# Patient Record
Sex: Female | Born: 1960 | Race: Black or African American | Hispanic: No | Marital: Single | Smoking: Never smoker
Health system: Southern US, Community
[De-identification: ages and names within clinical notes are randomized; demographics above are authoritative.]

## PROBLEM LIST (undated history)

## (undated) DIAGNOSIS — Z8041 Family history of malignant neoplasm of ovary: Secondary | ICD-10-CM

## (undated) DIAGNOSIS — Z803 Family history of malignant neoplasm of breast: Secondary | ICD-10-CM

## (undated) DIAGNOSIS — Z1371 Encounter for nonprocreative screening for genetic disease carrier status: Secondary | ICD-10-CM

## (undated) DIAGNOSIS — I10 Essential (primary) hypertension: Secondary | ICD-10-CM

## (undated) HISTORY — DX: Family history of malignant neoplasm of breast: Z80.3

## (undated) HISTORY — DX: Encounter for nonprocreative screening for genetic disease carrier status: Z13.71

## (undated) HISTORY — DX: Essential (primary) hypertension: I10

## (undated) HISTORY — PX: WRIST SURGERY: SHX841

## (undated) HISTORY — DX: Family history of malignant neoplasm of ovary: Z80.41

---

## 2004-08-27 ENCOUNTER — Emergency Department: Payer: Self-pay | Admitting: Emergency Medicine

## 2005-05-07 ENCOUNTER — Ambulatory Visit: Payer: Self-pay | Admitting: Family Medicine

## 2006-12-19 ENCOUNTER — Other Ambulatory Visit: Payer: Self-pay

## 2006-12-19 ENCOUNTER — Emergency Department: Payer: Self-pay | Admitting: Emergency Medicine

## 2009-01-17 ENCOUNTER — Ambulatory Visit: Payer: Self-pay | Admitting: Family Medicine

## 2009-12-23 ENCOUNTER — Ambulatory Visit: Payer: Self-pay | Admitting: Internal Medicine

## 2013-09-13 ENCOUNTER — Emergency Department: Payer: Self-pay | Admitting: Emergency Medicine

## 2013-12-05 ENCOUNTER — Ambulatory Visit: Payer: Self-pay | Admitting: Orthopedic Surgery

## 2014-07-15 DIAGNOSIS — Z803 Family history of malignant neoplasm of breast: Secondary | ICD-10-CM

## 2014-07-15 DIAGNOSIS — Z1371 Encounter for nonprocreative screening for genetic disease carrier status: Secondary | ICD-10-CM

## 2014-07-15 HISTORY — DX: Family history of malignant neoplasm of breast: Z80.3

## 2014-07-15 HISTORY — DX: Encounter for nonprocreative screening for genetic disease carrier status: Z13.71

## 2015-10-14 ENCOUNTER — Ambulatory Visit
Admission: RE | Admit: 2015-10-14 | Discharge: 2015-10-14 | Disposition: A | Discharge status: Home / Self Care | Payer: BC Managed Care – PPO | Source: Ambulatory Visit | Attending: Certified Nurse Midwife | Admitting: Certified Nurse Midwife

## 2015-10-14 ENCOUNTER — Other Ambulatory Visit: Payer: Self-pay | Admitting: Certified Nurse Midwife

## 2015-10-14 DIAGNOSIS — R52 Pain, unspecified: Secondary | ICD-10-CM

## 2015-10-14 DIAGNOSIS — M79652 Pain in left thigh: Secondary | ICD-10-CM | POA: Insufficient documentation

## 2015-10-14 DIAGNOSIS — R102 Pelvic and perineal pain: Secondary | ICD-10-CM | POA: Insufficient documentation

## 2015-10-14 DIAGNOSIS — W1830XA Fall on same level, unspecified, initial encounter: Secondary | ICD-10-CM | POA: Diagnosis not present

## 2015-10-16 ENCOUNTER — Other Ambulatory Visit: Payer: Self-pay | Admitting: Certified Nurse Midwife

## 2015-10-16 DIAGNOSIS — R102 Pelvic and perineal pain: Secondary | ICD-10-CM

## 2015-10-17 ENCOUNTER — Other Ambulatory Visit: Payer: Self-pay | Admitting: Certified Nurse Midwife

## 2015-10-17 ENCOUNTER — Ambulatory Visit
Admission: RE | Admit: 2015-10-17 | Discharge: 2015-10-17 | Disposition: A | Discharge status: Home / Self Care | Payer: BC Managed Care – PPO | Source: Ambulatory Visit | Attending: Certified Nurse Midwife | Admitting: Certified Nurse Midwife

## 2015-10-17 DIAGNOSIS — M79605 Pain in left leg: Secondary | ICD-10-CM

## 2015-10-17 DIAGNOSIS — R102 Pelvic and perineal pain: Secondary | ICD-10-CM | POA: Diagnosis present

## 2016-06-10 ENCOUNTER — Encounter: Payer: Self-pay | Admitting: Obstetrics & Gynecology

## 2016-06-10 ENCOUNTER — Ambulatory Visit: Payer: Self-pay | Admitting: Obstetrics & Gynecology

## 2017-03-24 IMAGING — US US EXTREM LOW VENOUS*L*
1 series · 13 of 24 positions shown · non-contrast
Comparison: Prior duplex venous ultrasound 12/23/2009

CLINICAL DATA: 55-year-old female with left lower extremity pain



[Series 1: us extrem low venous*left* · 0.07mm/px · 13 of 32 slices shown]
[im 1/32]
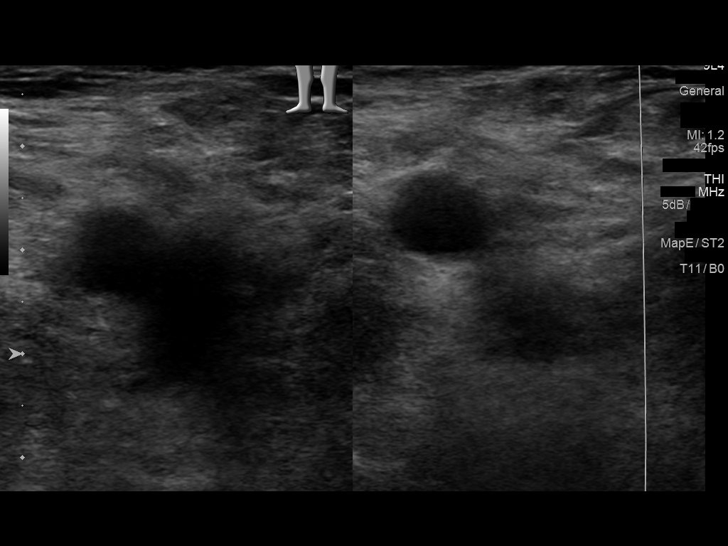
[im 3/32]
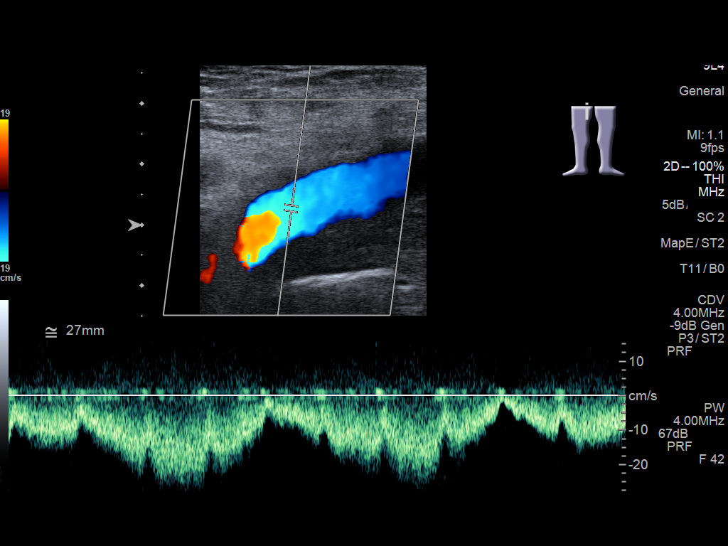
[im 6/32]
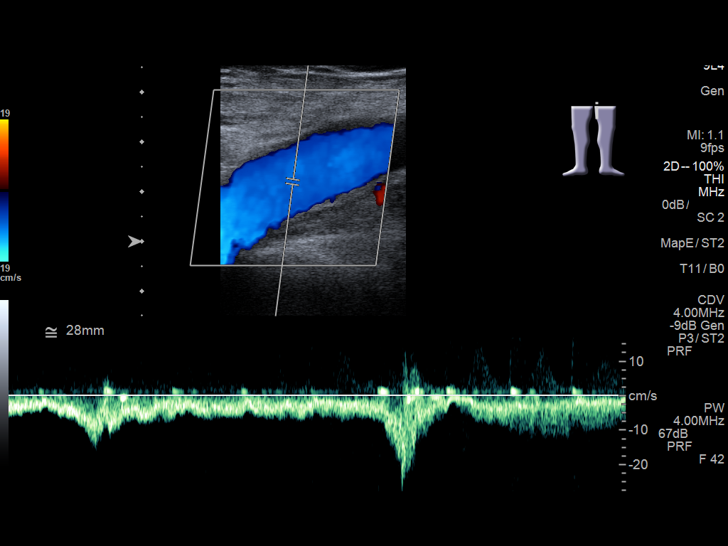
[im 9/32]
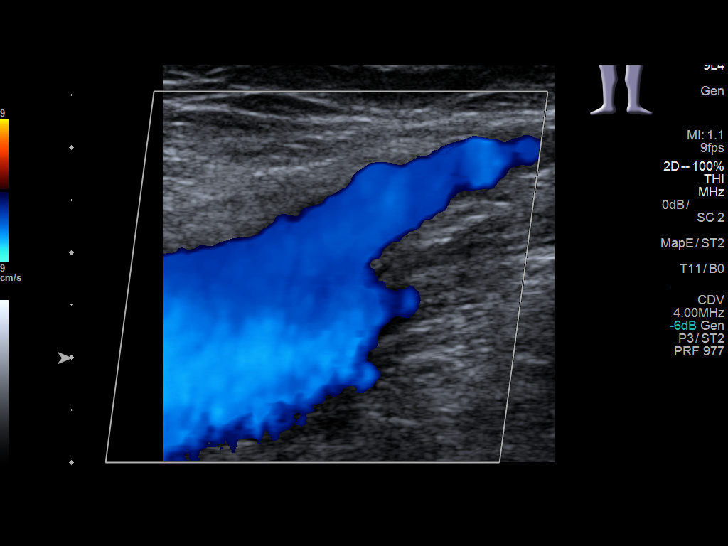
[im 11/32]
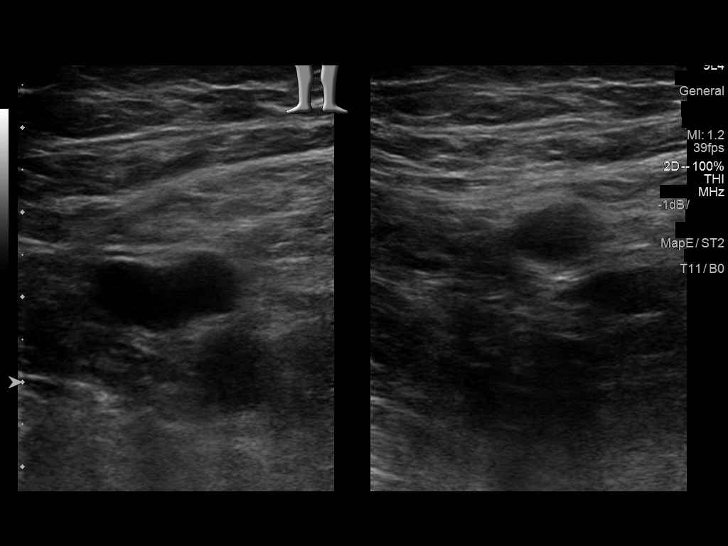
[im 14/32]
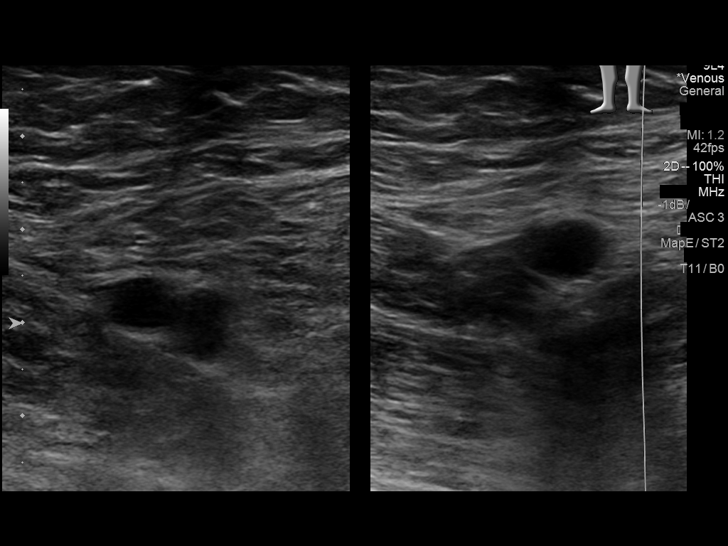
[im 17/32]
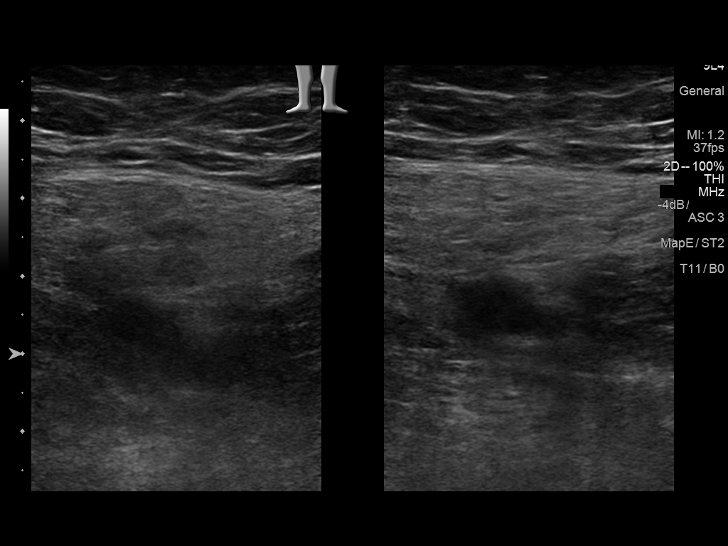
[im 18/32]
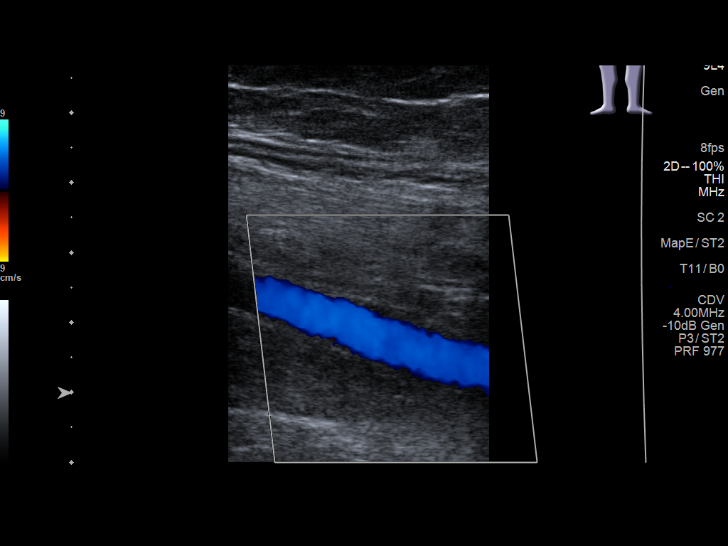
[im 21/32]
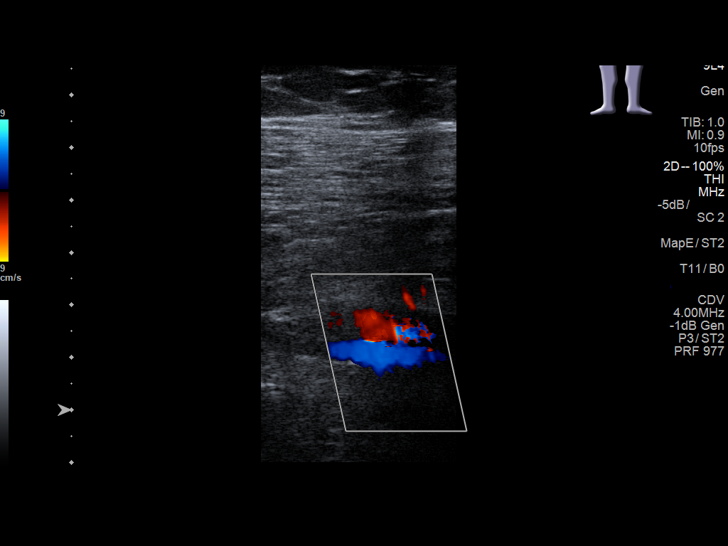
[im 23/32]
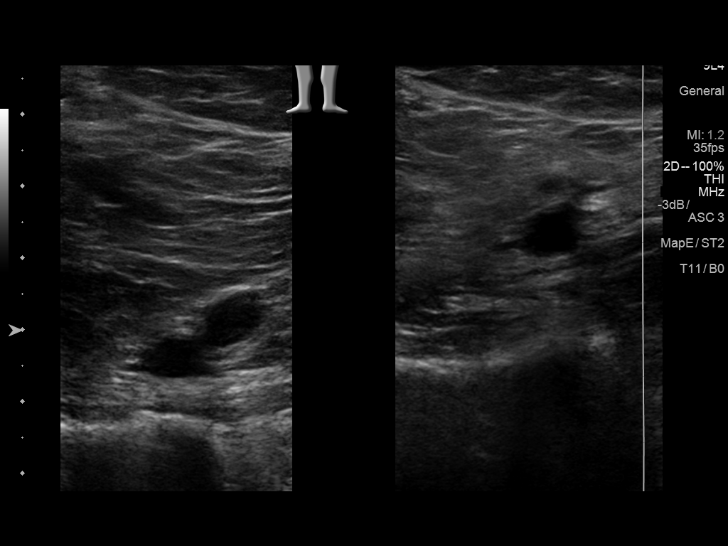
[im 26/32]
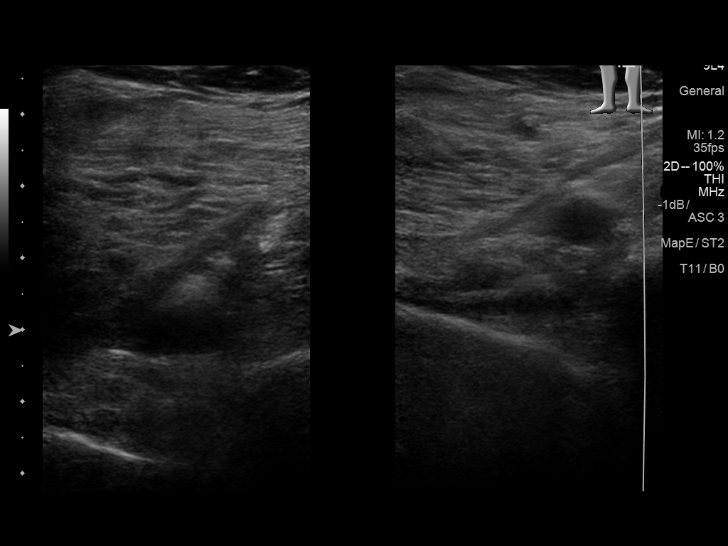
[im 29/32]
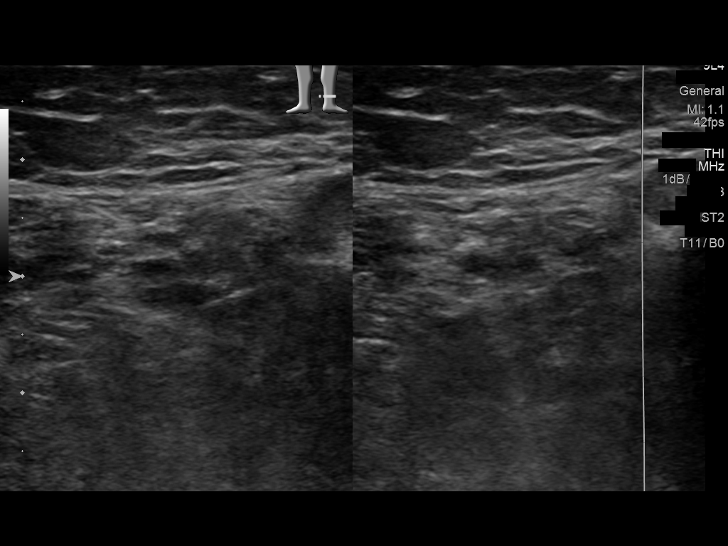
[im 32/32]
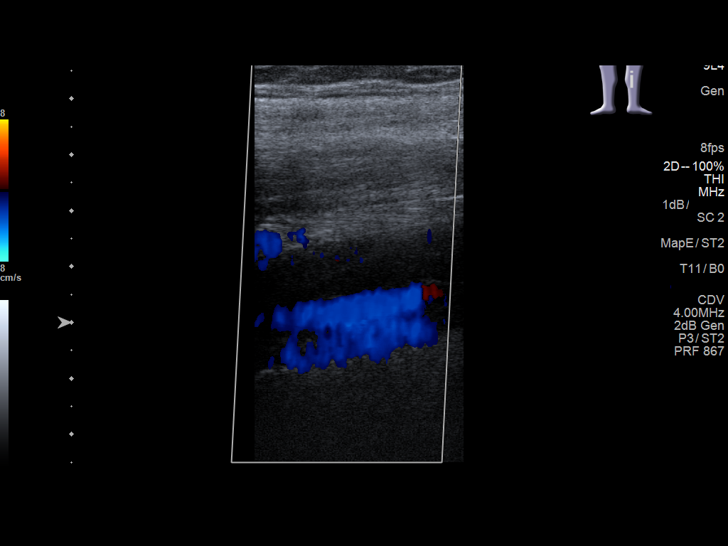

[13 of 24 positions shown; findings below may reference images not displayed]

FINDINGS: Contralateral Common Femoral Vein: Respiratory phasicity is normal
and symmetric with the symptomatic side. No evidence of thrombus.
Normal compressibility.

Common Femoral Vein: No evidence of thrombus. Normal
compressibility, respiratory phasicity and response to augmentation.

Saphenofemoral Junction: No evidence of thrombus. Normal
compressibility and flow on color Doppler imaging.

Profunda Femoral Vein: No evidence of thrombus. Normal
compressibility and flow on color Doppler imaging.

Femoral Vein: No evidence of thrombus. Normal compressibility,
respiratory phasicity and response to augmentation.

Popliteal Vein: No evidence of thrombus. Normal compressibility,
respiratory phasicity and response to augmentation.

Calf Veins: No evidence of thrombus. Normal compressibility and flow
on color Doppler imaging.

Superficial Great Saphenous Vein: No evidence of thrombus. Normal
compressibility and flow on color Doppler imaging.

Venous Reflux:  None.

Other Findings:  None.
IMPRESSION: No evidence of deep venous thrombosis.

## 2017-05-21 ENCOUNTER — Ambulatory Visit: Payer: Self-pay | Admitting: Obstetrics & Gynecology

## 2017-05-21 ENCOUNTER — Encounter: Payer: Self-pay | Admitting: Obstetrics & Gynecology

## 2017-05-21 ENCOUNTER — Ambulatory Visit (INDEPENDENT_AMBULATORY_CARE_PROVIDER_SITE_OTHER): Payer: BC Managed Care – PPO | Admitting: Obstetrics & Gynecology

## 2017-05-21 VITALS — BP 130/90 | HR 80 | Ht 67.0 in | Wt 204.0 lb

## 2017-05-21 DIAGNOSIS — Z13220 Encounter for screening for lipoid disorders: Secondary | ICD-10-CM

## 2017-05-21 DIAGNOSIS — Z1321 Encounter for screening for nutritional disorder: Secondary | ICD-10-CM

## 2017-05-21 DIAGNOSIS — Z131 Encounter for screening for diabetes mellitus: Secondary | ICD-10-CM

## 2017-05-21 DIAGNOSIS — Z1211 Encounter for screening for malignant neoplasm of colon: Secondary | ICD-10-CM

## 2017-05-21 DIAGNOSIS — Z1329 Encounter for screening for other suspected endocrine disorder: Secondary | ICD-10-CM | POA: Diagnosis not present

## 2017-05-21 DIAGNOSIS — Z124 Encounter for screening for malignant neoplasm of cervix: Secondary | ICD-10-CM

## 2017-05-21 DIAGNOSIS — Z0000 Encounter for general adult medical examination without abnormal findings: Secondary | ICD-10-CM | POA: Diagnosis not present

## 2017-05-21 DIAGNOSIS — E669 Obesity, unspecified: Secondary | ICD-10-CM | POA: Diagnosis not present

## 2017-05-21 DIAGNOSIS — Z1231 Encounter for screening mammogram for malignant neoplasm of breast: Secondary | ICD-10-CM | POA: Diagnosis not present

## 2017-05-21 DIAGNOSIS — Z1239 Encounter for other screening for malignant neoplasm of breast: Secondary | ICD-10-CM

## 2017-05-21 NOTE — Progress Notes (Signed)
HPI:      Ms. April Cross is a 57 y.o. [phone] who LMP was No LMP recorded (lmp unknown). Patient has had a hysterectomy., she presents today for her annual examination. The patient has no complaints today. The patient is sexually active. Her last pap: approximate date 2016 and was normal and last mammogram: approximate date 2016 and was normal. The patient does perform self breast exams.  There is notable family history of breast or ovarian cancer in her family. BRCA Neg (she has a variant of unknown utility); T-C 16 % lifetime risk. The patient has regular exercise: yes.  The patient denies current symptoms of depression.    GYN History: Contraception: IUD, 5 years this fall.  No sx's of menopause, no bleeding.  FH menopause 21, 20 (mom, sis). Colonoscopy, not done yet No recent labs  PMHx: Past Medical History:  Diagnosis Date  . Hypertension    Past Surgical History:  Procedure Laterality Date  . WRIST SURGERY     Family History  Problem Relation Age of Onset  . Brain cancer Paternal Uncle   . Lung cancer Paternal Uncle   . Leukemia Maternal Grandmother   . Ovarian cancer Maternal Grandmother   . Breast cancer Paternal Grandfather    Social History   Tobacco Use  . Smoking status: Never Smoker  . Smokeless tobacco: Never Used  Substance Use Topics  . Alcohol use: No    Frequency: Never  . Drug use: No    Current Outpatient Medications:  .  levonorgestrel (MIRENA) 20 MCG/24HR IUD, 1 each by Intrauterine route once., Disp: , Rfl:  .  methocarbamol (ROBAXIN) 500 MG tablet, TAKE 1 (ONE) TABLET EVERY SIX HOURS, AS NEEDED FOR SPASM, Disp: , Rfl: 0 .  methylPREDNISolone (MEDROL DOSEPAK) 4 MG TBPK tablet, TAKE 6 TABLETS ON DAY 1 AS DIRECTED ON PACKAGE AND DECREASE BY 1 TAB EACH DAY FOR A TOTAL OF 6 DAYS, Disp: , Rfl: 0 .  naproxen (NAPROSYN) 500 MG tablet, TAKE 1 TABLET BY MOUTH 2 TIMES DAILY AS NEEDED, Disp: , Rfl: 0 Allergies: Penicillins  Review of Systems    Constitutional: Negative for chills, fever and malaise/fatigue.  HENT: Negative for congestion, sinus pain and sore throat.   Eyes: Negative for blurred vision and pain.  Respiratory: Negative for cough and wheezing.   Cardiovascular: Negative for chest pain and leg swelling.  Gastrointestinal: Negative for abdominal pain, constipation, diarrhea, heartburn, nausea and vomiting.  Genitourinary: Negative for dysuria, frequency, hematuria and urgency.  Musculoskeletal: Negative for back pain, joint pain, myalgias and neck pain.  Skin: Negative for itching and rash.  Neurological: Negative for dizziness, tremors and weakness.  Endo/Heme/Allergies: Does not bruise/bleed easily.  Psychiatric/Behavioral: Negative for depression. The patient is not nervous/anxious and does not have insomnia.     Objective: BP 130/90   Pulse 80   Ht '5\' 7"'$  (1.702 m)   Wt 204 lb (92.5 kg)   LMP  (LMP Unknown)   BMI 31.95 kg/m   Filed Weights   05/21/17 0951  Weight: 204 lb (92.5 kg)   Body mass index is 31.95 kg/m. Physical Exam  Constitutional: She is oriented to person, place, and time. She appears well-developed and well-nourished. No distress.  Genitourinary: Rectum normal, vagina normal and uterus normal. Pelvic exam was performed with patient supine. There is no rash or lesion on the right labia. There is no rash or lesion on the left labia. Vagina exhibits no lesion. No bleeding in  the vagina. Right adnexum does not display mass and does not display tenderness. Left adnexum does not display mass and does not display tenderness. Cervix does not exhibit motion tenderness, lesion, friability or polyp.   Uterus is mobile and midaxial. Uterus is not enlarged or exhibiting a mass.  HENT:  Head: Normocephalic and atraumatic. Head is without laceration.  Right Ear: Hearing normal.  Left Ear: Hearing normal.  Nose: No epistaxis.  No foreign bodies.  Mouth/Throat: Uvula is midline, oropharynx is clear and  moist and mucous membranes are normal.  Eyes: Pupils are equal, round, and reactive to light.  Neck: Normal range of motion. Neck supple. No thyromegaly present.  Cardiovascular: Normal rate and regular rhythm. Exam reveals no gallop and no friction rub.  No murmur heard. Pulmonary/Chest: Effort normal and breath sounds normal. No respiratory distress. She has no wheezes. Right breast exhibits no mass, no skin change and no tenderness. Left breast exhibits no mass, no skin change and no tenderness.  Abdominal: Soft. Bowel sounds are normal. She exhibits no distension. There is no tenderness. There is no rebound.  Musculoskeletal: Normal range of motion.  Neurological: She is alert and oriented to person, place, and time. No cranial nerve deficit.  Skin: Skin is warm and dry.  Psychiatric: She has a normal mood and affect. Judgment normal.  Vitals reviewed.  Assessment: 1. Annual physical exam   2. Screening for breast cancer   3. Screening for cervical cancer   4. Screening for thyroid disorder   5. Screening for diabetes mellitus   6. Encounter for vitamin deficiency screening   7. Screening for cholesterol level   8. Screen for colon cancer   9. Obesity (BMI 30-39.9)    Screening Plan:            1.  Cervical Screening-  Pap smear done today  2. Breast screening- Exam annually and mammogram>40 planned   3. Colonoscopy every 10 years - - - needs to be done (will schedule), Hemoccult testing - after age 31  4. Labs To return fasting at a later date  5. Counseling for contraception: IUD  To be removed in fall; monitor for bleeding and menopause sx's thereafter  6. Obesity (BMI 30-39.9) - Weight loss, diet and exercise counseled    F/U  Return in about 6 months (around 11/21/2017) for Follow up for IUD removal, also LAB appt soon.  Barnett Applebaum, MD, [facility], [facility] 05/21/2017  10:28 AM

## 2017-05-21 NOTE — Patient Instructions (Signed)
PAP every three years Mammogram every year    Call [phone] to schedule at [place] Colonoscopy every 10 years Labs soon 

## 2017-05-25 ENCOUNTER — Encounter: Payer: Self-pay | Admitting: Obstetrics and Gynecology

## 2017-05-25 LAB — IGP, APTIMA HPV
HPV APTIMA: NEGATIVE
PAP SMEAR COMMENT: 0

## 2017-05-26 ENCOUNTER — Ambulatory Visit: Payer: Self-pay | Admitting: Obstetrics & Gynecology

## 2017-06-04 ENCOUNTER — Other Ambulatory Visit: Payer: Self-pay

## 2017-06-10 ENCOUNTER — Other Ambulatory Visit: Payer: Self-pay

## 2017-06-10 ENCOUNTER — Telehealth: Payer: Self-pay

## 2017-06-10 DIAGNOSIS — Z1211 Encounter for screening for malignant neoplasm of colon: Secondary | ICD-10-CM

## 2017-06-10 NOTE — Telephone Encounter (Signed)
Gastroenterology Pre-Procedure Review  Request Date: 07/08/17 Requesting Physician: Dr. Bonna Gains  PATIENT REVIEW QUESTIONS: The patient responded to the following health history questions as indicated:    1. Are you having any GI issues? yes (Constipation, Bowel Movement Infrequency requiring pt to take Miralax) 2. Do you have a personal history of Polyps? no 3. Do you have a family history of Colon Cancer or Polyps? no 4. Diabetes Mellitus? no 5. Joint replacements in the past 12 months?no 6. Major health problems in the past 3 months?no 7. Any artificial heart valves, MVP, or defibrillator?no    MEDICATIONS & ALLERGIES:    Patient reports the following regarding taking any anticoagulation/antiplatelet therapy:   Plavix, Coumadin, Eliquis, Xarelto, Lovenox, Pradaxa, Brilinta, or Effient? no Aspirin? no  Patient confirms/reports the following medications:  Current Outpatient Medications  Medication Sig Dispense Refill  . levonorgestrel (MIRENA) 20 MCG/24HR IUD 1 each by Intrauterine route once.    . methocarbamol (ROBAXIN) 500 MG tablet TAKE 1 (ONE) TABLET EVERY SIX HOURS, AS NEEDED FOR SPASM  0  . methylPREDNISolone (MEDROL DOSEPAK) 4 MG TBPK tablet TAKE 6 TABLETS ON DAY 1 AS DIRECTED ON PACKAGE AND DECREASE BY 1 TAB EACH DAY FOR A TOTAL OF 6 DAYS  0  . naproxen (NAPROSYN) 500 MG tablet TAKE 1 TABLET BY MOUTH 2 TIMES DAILY AS NEEDED  0   No current facility-administered medications for this visit.     Patient confirms/reports the following allergies:  Allergies  Allergen Reactions  . Penicillins     No orders of the defined types were placed in this encounter.   AUTHORIZATION INFORMATION Primary Insurance: 1D#: Group #:  Secondary Insurance: 1D#: Group #:  SCHEDULE INFORMATION: Date: 07/08/17 Time: Location:[facility]

## 2017-06-16 ENCOUNTER — Other Ambulatory Visit: Payer: BC Managed Care – PPO

## 2017-07-08 ENCOUNTER — Ambulatory Visit: Payer: BC Managed Care – PPO | Admitting: Anesthesiology

## 2017-07-08 ENCOUNTER — Encounter
Admission: RE | Disposition: A | Discharge status: Home / Self Care | Payer: Self-pay | Source: Ambulatory Visit | Attending: Gastroenterology

## 2017-07-08 ENCOUNTER — Ambulatory Visit
Admission: RE | Admit: 2017-07-08 | Discharge: 2017-07-08 | Disposition: A | Discharge status: Home / Self Care | Payer: BC Managed Care – PPO | Source: Ambulatory Visit | Attending: Gastroenterology | Admitting: Gastroenterology

## 2017-07-08 DIAGNOSIS — I10 Essential (primary) hypertension: Secondary | ICD-10-CM | POA: Insufficient documentation

## 2017-07-08 DIAGNOSIS — D123 Benign neoplasm of transverse colon: Secondary | ICD-10-CM | POA: Diagnosis not present

## 2017-07-08 DIAGNOSIS — Z7952 Long term (current) use of systemic steroids: Secondary | ICD-10-CM | POA: Insufficient documentation

## 2017-07-08 DIAGNOSIS — K621 Rectal polyp: Secondary | ICD-10-CM | POA: Insufficient documentation

## 2017-07-08 DIAGNOSIS — Z791 Long term (current) use of non-steroidal anti-inflammatories (NSAID): Secondary | ICD-10-CM | POA: Insufficient documentation

## 2017-07-08 DIAGNOSIS — Z975 Presence of (intrauterine) contraceptive device: Secondary | ICD-10-CM | POA: Diagnosis not present

## 2017-07-08 DIAGNOSIS — Z880 Allergy status to penicillin: Secondary | ICD-10-CM | POA: Diagnosis not present

## 2017-07-08 DIAGNOSIS — Z1211 Encounter for screening for malignant neoplasm of colon: Secondary | ICD-10-CM | POA: Diagnosis present

## 2017-07-08 HISTORY — PX: COLONOSCOPY WITH PROPOFOL: SHX5780

## 2017-07-08 SURGERY — COLONOSCOPY WITH PROPOFOL
Anesthesia: General

## 2017-07-08 MED ORDER — MIDAZOLAM HCL 2 MG/2ML IJ SOLN
INTRAMUSCULAR | Status: AC
  Filled 2017-07-08: qty 2

## 2017-07-08 MED ORDER — LIDOCAINE HCL (PF) 1 % IJ SOLN
2.0000 mL | Freq: Once | INTRAMUSCULAR | Status: AC
  Administered 2017-07-08: 0.3 mL via INTRADERMAL

## 2017-07-08 MED ORDER — FENTANYL CITRATE (PF) 100 MCG/2ML IJ SOLN
INTRAMUSCULAR | Status: AC
  Filled 2017-07-08: qty 2

## 2017-07-08 MED ORDER — PROPOFOL 500 MG/50ML IV EMUL
INTRAVENOUS | Status: DC | PRN
  Administered 2017-07-08: 180 ug/kg/min via INTRAVENOUS

## 2017-07-08 MED ORDER — LIDOCAINE HCL (PF) 2 % IJ SOLN
INTRAMUSCULAR | Status: AC
  Filled 2017-07-08: qty 10

## 2017-07-08 MED ORDER — FENTANYL CITRATE (PF) 100 MCG/2ML IJ SOLN
INTRAMUSCULAR | Status: DC | PRN
  Administered 2017-07-08: 50 ug via INTRAVENOUS

## 2017-07-08 MED ORDER — SODIUM CHLORIDE 0.9 % IV SOLN
INTRAVENOUS | Status: DC
  Administered 2017-07-08: 1000 mL via INTRAVENOUS

## 2017-07-08 MED ORDER — PROPOFOL 10 MG/ML IV BOLUS
INTRAVENOUS | Status: DC | PRN
  Administered 2017-07-08: 50 mg via INTRAVENOUS

## 2017-07-08 MED ORDER — MIDAZOLAM HCL 2 MG/2ML IJ SOLN
INTRAMUSCULAR | Status: DC | PRN
  Administered 2017-07-08: 2 mg via INTRAVENOUS

## 2017-07-08 MED ORDER — LIDOCAINE HCL (PF) 1 % IJ SOLN
INTRAMUSCULAR | Status: AC
  Administered 2017-07-08: 0.3 mL via INTRADERMAL
  Filled 2017-07-08: qty 2

## 2017-07-08 MED ORDER — PROPOFOL 500 MG/50ML IV EMUL
INTRAVENOUS | Status: AC
  Filled 2017-07-08: qty 50

## 2017-07-08 NOTE — Op Note (Signed)
[facility] Patient Name: April Cross Procedure Date: 07/08/2017 8:10 AM MRN: [id number] Account #: [id number] Date of Birth: 20-Jan-1961 Admit Type: Outpatient Age: 57 Room: [facility] ENDO ROOM 2 Gender: Female Note Status: Finalized Procedure:            Colonoscopy Indications:          Screening for colorectal malignant neoplasm, This is                        the patient's first colonoscopy Providers:            Lin Landsman MD, MD Referring MD:         Ashok Norris, MD (Referring MD) Medicines:            Monitored Anesthesia Care Complications:        No immediate complications. Estimated blood loss:                        Minimal. Procedure:            Pre-Anesthesia Assessment:                       - Prior to the procedure, a History and Physical was                        performed, and patient medications and allergies were                        reviewed. The patient is competent. The risks and                        benefits of the procedure and the sedation options and                        risks were discussed with the patient. All questions                        were answered and informed consent was obtained.                        Patient identification and proposed procedure were                        verified by the physician, the nurse, the                        anesthesiologist, the anesthetist and the technician in                        the pre-procedure area in the procedure room in the                        endoscopy suite. Mental Status Examination: alert and                        oriented. Airway Examination: normal oropharyngeal                        airway and neck mobility. Respiratory Examination:  clear to auscultation. CV Examination: normal.                        Prophylactic Antibiotics: The patient does not require                        prophylactic antibiotics. Prior  Anticoagulants: The                        patient has taken no previous anticoagulant or                        antiplatelet agents. ASA Grade Assessment: II - A                        patient with mild systemic disease. After reviewing the                        risks and benefits, the patient was deemed in                        satisfactory condition to undergo the procedure. The                        anesthesia plan was to use monitored anesthesia care                        (MAC). Immediately prior to administration of                        medications, the patient was re-assessed for adequacy                        to receive sedatives. The heart rate, respiratory rate,                        oxygen saturations, blood pressure, adequacy of                        pulmonary ventilation, and response to care were                        monitored throughout the procedure. The physical status                        of the patient was re-assessed after the procedure.                       After obtaining informed consent, the colonoscope was                        passed under direct vision. Throughout the procedure,                        the patient's blood pressure, pulse, and oxygen                        saturations were monitored continuously. The                        Colonoscope  was introduced through the anus and                        advanced to the the cecum, identified by appendiceal                        orifice and ileocecal valve. The colonoscopy was                        performed without difficulty. The patient tolerated the                        procedure well. The quality of the bowel preparation                        was evaluated using the BBPS [facility] Bowel Preparation                        Scale) with scores of: Right Colon = 3, Transverse                        Colon = 3 and Left Colon = 3 (entire mucosa seen well                        with no residual  staining, small fragments of stool or                        opaque liquid). The total BBPS score equals 9. Findings:      The perianal and digital rectal examinations were normal. Pertinent       negatives include normal sphincter tone and no palpable rectal lesions.      A 9 mm polyp was found in the transverse colon. The polyp was sessile.       The polyp was removed with a hot snare. Resection and retrieval were       complete.      A 5 mm polyp was found in the rectum. The polyp was sessile. The polyp       was removed with a cold snare. Resection and retrieval were complete.      The retroflexed view of the distal rectum and anal verge was normal and       showed no anal or rectal abnormalities.      The exam was otherwise without abnormality. Impression:           - One 9 mm polyp in the transverse colon, removed with                        a hot snare. Resected and retrieved.                       - One 5 mm polyp in the rectum, removed with a cold                        snare. Resected and retrieved.                       - The distal rectum and anal verge are normal on  retroflexion view.                       - The examination was otherwise normal. Recommendation:       - Discharge patient to home (with escort).                       - Resume previous diet today.                       - Continue present medications.                       - Await pathology results.                       - Repeat colonoscopy in 3 - 5 years for surveillance                        based on pathology results. Procedure Code(s):    --- Professional ---                       [phone], Colonoscopy, flexible; with removal of tumor(s),                        polyp(s), or other lesion(s) by snare technique Diagnosis Code(s):    --- Professional ---                       Z12.11, Encounter for screening for malignant neoplasm                        of colon                        D12.3, Benign neoplasm of transverse colon (hepatic                        flexure or splenic flexure)                       K62.1, Rectal polyp CPT copyright 2017 American Medical Association. All rights reserved. The codes documented in this report are preliminary and upon coder review may  be revised to meet current compliance requirements. Dr. Ulyess Mort Lin Landsman MD, MD 07/08/2017 8:37:17 AM This report has been signed electronically. Number of Addenda: 0 Note Initiated On: 07/08/2017 8:10 AM Scope Withdrawal Time: 0 hours 14 minutes 42 seconds  Total Procedure Duration: 0 hours 19 minutes 51 seconds       [facility]

## 2017-07-08 NOTE — Anesthesia Postprocedure Evaluation (Signed)
Anesthesia Post Note  Patient: April Cross  Procedure(s) Performed: COLONOSCOPY WITH PROPOFOL (N/A )  Patient location during evaluation: Endoscopy Anesthesia Type: General Level of consciousness: awake and alert Pain management: pain level controlled Vital Signs Assessment: post-procedure vital signs reviewed and stable Respiratory status: spontaneous breathing and respiratory function stable Cardiovascular status: stable Anesthetic complications: no     Last Vitals:  Vitals:   07/08/17 0712 07/08/17 0842  BP: (!) 126/99   Pulse: 99   Resp: 17   Temp: (!) 36.2 C (!) 36.3 C  SpO2: 100%     Last Pain:  Vitals:   07/08/17 0842  TempSrc: Tympanic  PainSc:                  Dennard Nip K

## 2017-07-08 NOTE — Anesthesia Preprocedure Evaluation (Signed)
Anesthesia Evaluation  Patient identified by MRN, date of birth, ID band Patient awake    Reviewed: Allergy & Precautions, NPO status , Patient's Chart, lab work & pertinent test results  History of Anesthesia Complications Negative for: history of anesthetic complications  Airway Mallampati: III       Dental   Pulmonary neg sleep apnea, neg COPD,           Cardiovascular hypertension (hx , no problems in last few years), (-) Past MI and (-) CHF (-) dysrhythmias (-) Valvular Problems/Murmurs     Neuro/Psych neg Seizures    GI/Hepatic Neg liver ROS, neg GERD  ,  Endo/Other  neg diabetes  Renal/GU negative Renal ROS     Musculoskeletal   Abdominal   Peds  Hematology   Anesthesia Other Findings   Reproductive/Obstetrics                             Anesthesia Physical Anesthesia Plan  ASA: II  Anesthesia Plan: General   Post-op Pain Management:    Induction: Intravenous  PONV Risk Score and Plan: 3 and TIVA, Propofol infusion and Treatment may vary due to age or medical condition  Airway Management Planned: Nasal Cannula  Additional Equipment:   Intra-op Plan:   Post-operative Plan:   Informed Consent: I have reviewed the patients History and Physical, chart, labs and discussed the procedure including the risks, benefits and alternatives for the proposed anesthesia with the patient or authorized representative who has indicated his/her understanding and acceptance.     Plan Discussed with:   Anesthesia Plan Comments:         Anesthesia Quick Evaluation

## 2017-07-08 NOTE — Transfer of Care (Signed)
Immediate Anesthesia Transfer of Care Note  Patient: April Cross  Procedure(s) Performed: COLONOSCOPY WITH PROPOFOL (N/A )  Patient Location: PACU  Anesthesia Type:General  Level of Consciousness: sedated  Airway & Oxygen Therapy: Patient Spontanous Breathing  Post-op Assessment: Report given to RN and Post -op Vital signs reviewed and stable  Post vital signs: Reviewed and stable  Last Vitals:  Vitals Value Taken Time  BP 117/86 07/08/2017  8:42 AM  Temp 36.3 C 07/08/2017  8:42 AM  Pulse 94 07/08/2017  8:46 AM  Resp 22 07/08/2017  8:46 AM  SpO2 96 % 07/08/2017  8:46 AM  Vitals shown include unvalidated device data.  Last Pain:  Vitals:   07/08/17 0842  TempSrc: Tympanic  PainSc:          Complications: No apparent anesthesia complications

## 2017-07-08 NOTE — H&P (Signed)
Cephas Darby, MD [address]  [address] [place]  [place], [place] [address]  Main: [phone]  Fax: [phone] Pager: [phone]  Primary Care Physician:  Ashok Norris, MD Primary Gastroenterologist:  Dr. Cephas Darby  Pre-Procedure History & Physical: HPI:  April Cross is a 57 y.o. female is here for an colonoscopy.   Past Medical History:  Diagnosis Date  . BRCA negative 07/2014   MyRisk neg except APC VUS  . Family history of breast cancer 07/2014   IBIS=16.6%  . Family history of ovarian cancer   . Hypertension     Past Surgical History:  Procedure Laterality Date  . WRIST SURGERY      Prior to Admission medications   Medication Sig Start Date End Date Taking? Authorizing Provider  levonorgestrel (MIRENA) 20 MCG/24HR IUD 1 each by Intrauterine route once.    [provider]  methocarbamol (ROBAXIN) 500 MG tablet TAKE 1 (ONE) TABLET EVERY SIX HOURS, AS NEEDED FOR SPASM 03/13/16   [provider]  methylPREDNISolone (MEDROL DOSEPAK) 4 MG TBPK tablet TAKE 6 TABLETS ON DAY 1 AS DIRECTED ON PACKAGE AND DECREASE BY 1 TAB EACH DAY FOR A TOTAL OF 6 DAYS 05/11/16   [provider]  naproxen (NAPROSYN) 500 MG tablet TAKE 1 TABLET BY MOUTH 2 TIMES DAILY AS NEEDED 04/17/16   [provider]    Allergies as of 06/10/2017 - Review Complete 05/21/2017  Allergen Reaction Noted  . Penicillins      Family History  Problem Relation Age of Onset  . Brain cancer Paternal Uncle   . Lung cancer Paternal Uncle   . Leukemia Maternal Grandmother   . Ovarian cancer Maternal Grandmother   . Breast cancer Paternal Grandfather     Social History   Socioeconomic History  . Marital status: Single    Spouse name: Not on file  . Number of children: Not on file  . Years of education: Not on file  . Highest education level: Not on file  Occupational History  . Not on file  Social Needs  . Financial resource strain: Not on file  .  Food insecurity:    Worry: Not on file    Inability: Not on file  . Transportation needs:    Medical: Not on file    Non-medical: Not on file  Tobacco Use  . Smoking status: Never Smoker  . Smokeless tobacco: Never Used  Substance and Sexual Activity  . Alcohol use: No    Frequency: Never  . Drug use: No  . Sexual activity: Not Currently    Birth control/protection: IUD  Lifestyle  . Physical activity:    Days per week: Not on file    Minutes per session: Not on file  . Stress: Not on file  Relationships  . Social connections:    Talks on phone: Not on file    Gets together: Not on file    Attends religious service: Not on file    Active member of club or organization: Not on file    Attends meetings of clubs or organizations: Not on file    Relationship status: Not on file  . Intimate partner violence:    Fear of current or ex partner: Not on file    Emotionally abused: Not on file    Physically abused: Not on file    Forced sexual activity: Not on file  Other Topics Concern  . Not on file  Social History Narrative  .  Not on file    Review of Systems: See HPI, otherwise negative ROS  Physical Exam: BP (!) 126/99   Pulse 99   Temp (!) 97.2 F (36.2 C) (Tympanic)   Resp 17   Ht _0  (1.702 m)   Wt 204 lb (92.5 kg)   LMP  (LMP Unknown)   SpO2 100%   BMI 31.95 kg/m  General:   Alert,  pleasant and cooperative in NAD Head:  Normocephalic and atraumatic. Neck:  Supple; no masses or thyromegaly. Lungs:  Clear throughout to auscultation.    Heart:  Regular rate and rhythm. Abdomen:  Soft, nontender and nondistended. Normal bowel sounds, without guarding, and without rebound.   Neurologic:  Alert and  oriented x4;  grossly normal neurologically.  Impression/Plan: April Cross is here for an colonoscopy to be performed for colon cancer screening  Risks, benefits, limitations, and alternatives regarding  colonoscopy have been reviewed with the patient.   Questions have been answered.  All parties agreeable.   April Sear, MD  07/08/2017, 8:42 AM

## 2017-07-08 NOTE — Anesthesia Procedure Notes (Signed)
Date/Time: 07/08/2017 8:10 AM Performed by: Allean Found, CRNA Pre-anesthesia Checklist: Patient identified, Emergency Drugs available, Suction available, Patient being monitored and Timeout performed Patient Re-evaluated:Patient Re-evaluated prior to induction Oxygen Delivery Method: Nasal cannula Placement Confirmation: positive ETCO2

## 2017-07-08 NOTE — Anesthesia Post-op Follow-up Note (Signed)
Anesthesia QCDR form completed.        

## 2017-07-09 ENCOUNTER — Encounter: Payer: Self-pay | Admitting: Gastroenterology

## 2017-07-09 LAB — SURGICAL PATHOLOGY

## 2017-11-26 ENCOUNTER — Ambulatory Visit: Payer: BC Managed Care – PPO | Admitting: Obstetrics & Gynecology

## 2017-11-29 ENCOUNTER — Other Ambulatory Visit: Payer: Self-pay

## 2017-11-29 ENCOUNTER — Encounter: Payer: Self-pay | Admitting: Obstetrics & Gynecology

## 2017-11-29 ENCOUNTER — Ambulatory Visit (INDEPENDENT_AMBULATORY_CARE_PROVIDER_SITE_OTHER): Payer: BC Managed Care – PPO | Admitting: Obstetrics & Gynecology

## 2017-11-29 ENCOUNTER — Other Ambulatory Visit: Payer: Self-pay | Admitting: Obstetrics & Gynecology

## 2017-11-29 VITALS — BP 140/98 | Ht 67.0 in | Wt 202.0 lb

## 2017-11-29 DIAGNOSIS — E669 Obesity, unspecified: Secondary | ICD-10-CM

## 2017-11-29 DIAGNOSIS — Z780 Asymptomatic menopausal state: Secondary | ICD-10-CM | POA: Diagnosis not present

## 2017-11-29 DIAGNOSIS — Z30432 Encounter for removal of intrauterine contraceptive device: Secondary | ICD-10-CM

## 2017-11-29 DIAGNOSIS — Z1329 Encounter for screening for other suspected endocrine disorder: Secondary | ICD-10-CM

## 2017-11-29 DIAGNOSIS — Z1211 Encounter for screening for malignant neoplasm of colon: Secondary | ICD-10-CM

## 2017-11-29 DIAGNOSIS — Z131 Encounter for screening for diabetes mellitus: Secondary | ICD-10-CM

## 2017-11-29 DIAGNOSIS — Z1321 Encounter for screening for nutritional disorder: Secondary | ICD-10-CM

## 2017-11-29 NOTE — Patient Instructions (Signed)

## 2017-11-29 NOTE — Progress Notes (Signed)
History of Present Illness:  April Cross is a 57 y.o. that had a Mirena IUD placed approximately 5 years ago. Since that time, she states that no periods or side effects.  Now that she is 59, she is here for removal and no further birth control.  .  The following portions of the patient's history were reviewed and updated as appropriate: allergies, current medications, past family history, past medical history, past social history, past surgical history and problem list.  Patient Active Problem List   Diagnosis Date Noted  . Special screening for malignant neoplasms, colon   . Annual physical exam 05/21/2017  . Obesity (BMI 30-39.9) 05/21/2017   Medications:  Current Outpatient Medications on File Prior to Visit  Medication Sig Dispense Refill  . levonorgestrel (MIRENA) 20 MCG/24HR IUD 1 each by Intrauterine route once.    . methocarbamol (ROBAXIN) 500 MG tablet TAKE 1 (ONE) TABLET EVERY SIX HOURS, AS NEEDED FOR SPASM  0  . methylPREDNISolone (MEDROL DOSEPAK) 4 MG TBPK tablet TAKE 6 TABLETS ON DAY 1 AS DIRECTED ON PACKAGE AND DECREASE BY 1 TAB EACH DAY FOR A TOTAL OF 6 DAYS  0  . naproxen (NAPROSYN) 500 MG tablet TAKE 1 TABLET BY MOUTH 2 TIMES DAILY AS NEEDED  0   No current facility-administered medications on file prior to visit.    Allergies: is allergic to penicillins.  Physical Exam:  BP (!) 140/98   Ht 5\' 7"  (1.702 m)   Wt 202 lb (91.6 kg)   LMP  (LMP Unknown)   BMI 31.64 kg/m  Body mass index is 31.64 kg/m. Constitutional: Well nourished, well developed female in no acute distress.  Abdomen: diffusely non tender to palpation, non distended, and no masses, hernias Neuro: Grossly intact Psych:  Normal mood and affect.    Pelvic exam:  Two IUD strings present seen coming from the cervical os. EGBUS, vaginal vault and cervix: within normal limits  IUD Removal Strings of IUD identified and grasped.  IUD removed without problem.  Pt tolerated this well.  IUD noted  to be intact.  Assessment: IUD Removal  Plan: IUD removed and plan for contraception is no method. Will test labs in 3 weeks Monitor for s/sx menopause She was amenable to this plan. Other labs today (screening)  HRT I have discussed MENOPAUSE and HRT as ell as alternatives with the patient in detail.  The risk/benefits of it were reviewed.  She understands that during menopause Estrogen decreases dramatically and that this results in an increased risk of cardiovascular disease as well as osteoporosis.  We have also discussed the fact that hot flashes often result from a decrease in Estrogen, and that by replacing Estrogen, they can often be alleviated.  We have discussed skin, vaginal and urinary tract changes that may also take place from this drop in Estrogen.  Emotional changes have also been linked to Estrogen and we have briefly discussed this.  The benefits of HRT including decrease in hot flashes, vaginal dryness, and osteoporosis were discussed.  The emotional benefit and a possible change in her cardiovascular risk profile was also reviewed.  The risks associated with Hormone Replacement Therapy were also reviewed.  The use of unopposed Estrogen and its relationship to endometrial cancer was discussed.  The addition of Progesterone and its beneficial effect on endometrial cancer was also noted.  The fact that there has been no consistent definitive studies showing an increase in breast cancer in women who use HRT was discussed  with the patient.  The possible side effects including breast tenderness, fluid retention, mood changes and vaginal bleeding were discussed.  The patient was informed that this is an elective medication and that she may choose not to take Hormone Replacement Therapy.  Literature on HRT was given, and I believe that after answering all of the patient's questions, she has an adequate and informed understanding of HRT.  Special emphasis on the WHI study, as well as several  studies since that pertaining to the risks and benefits of estrogen replacement therapy were compared.  The possible limitations of these studies were discussed including the age stratification of the WHI study.  The possible role of Progesterone in these studies was discussed in detail.  I believe that the patient has an informed knowledge of the risks and benefits of HRT.  I have specifically discussed WHI findings and current updates.  Different type of hormone formulation and methods of taking hormone replacement therapy discussed.  A total of 15 minutes were spent face-to-face with the patient during this encounter and over half of that time dealt with counseling and coordination of care.   Barnett Applebaum, M.D. 11/29/2017 8:51 AM

## 2017-11-30 LAB — LIPID PANEL
CHOL/HDL RATIO: 2.2 ratio (ref 0.0–4.4)
Cholesterol, Total: 131 mg/dL (ref 100–199)
HDL: 59 mg/dL (ref >39)
LDL Calculated: 63 mg/dL (ref 0–99)
Triglycerides: 46 mg/dL (ref 0–149)
VLDL Cholesterol Cal: 9 mg/dL (ref 5–40)

## 2017-11-30 LAB — VITAMIN D 25 HYDROXY (VIT D DEFICIENCY, FRACTURES): VIT D 25 HYDROXY: 15.4 ng/mL — AB (ref 30.0–100.0)

## 2017-11-30 LAB — HEMOGLOBIN A1C
Est. average glucose Bld gHb Est-mCnc: 117 mg/dL
HEMOGLOBIN A1C: 5.7 % — AB (ref 4.8–5.6)

## 2017-11-30 LAB — TSH: TSH: 1.18 u[IU]/mL (ref 0.450–4.500)

## 2017-12-01 ENCOUNTER — Encounter: Payer: Self-pay | Admitting: Obstetrics & Gynecology

## 2017-12-30 IMAGING — CR DG PELVIS 1-2V
1 series · 1 of 1 positions shown · non-contrast
Comparison: None.

CLINICAL DATA: Recent fall with abnormal sensation extending from
the pelvis into the left leg.

EXAM:
PELVIS - 1-2 VIEW

[dg pelvis 1-2 views]
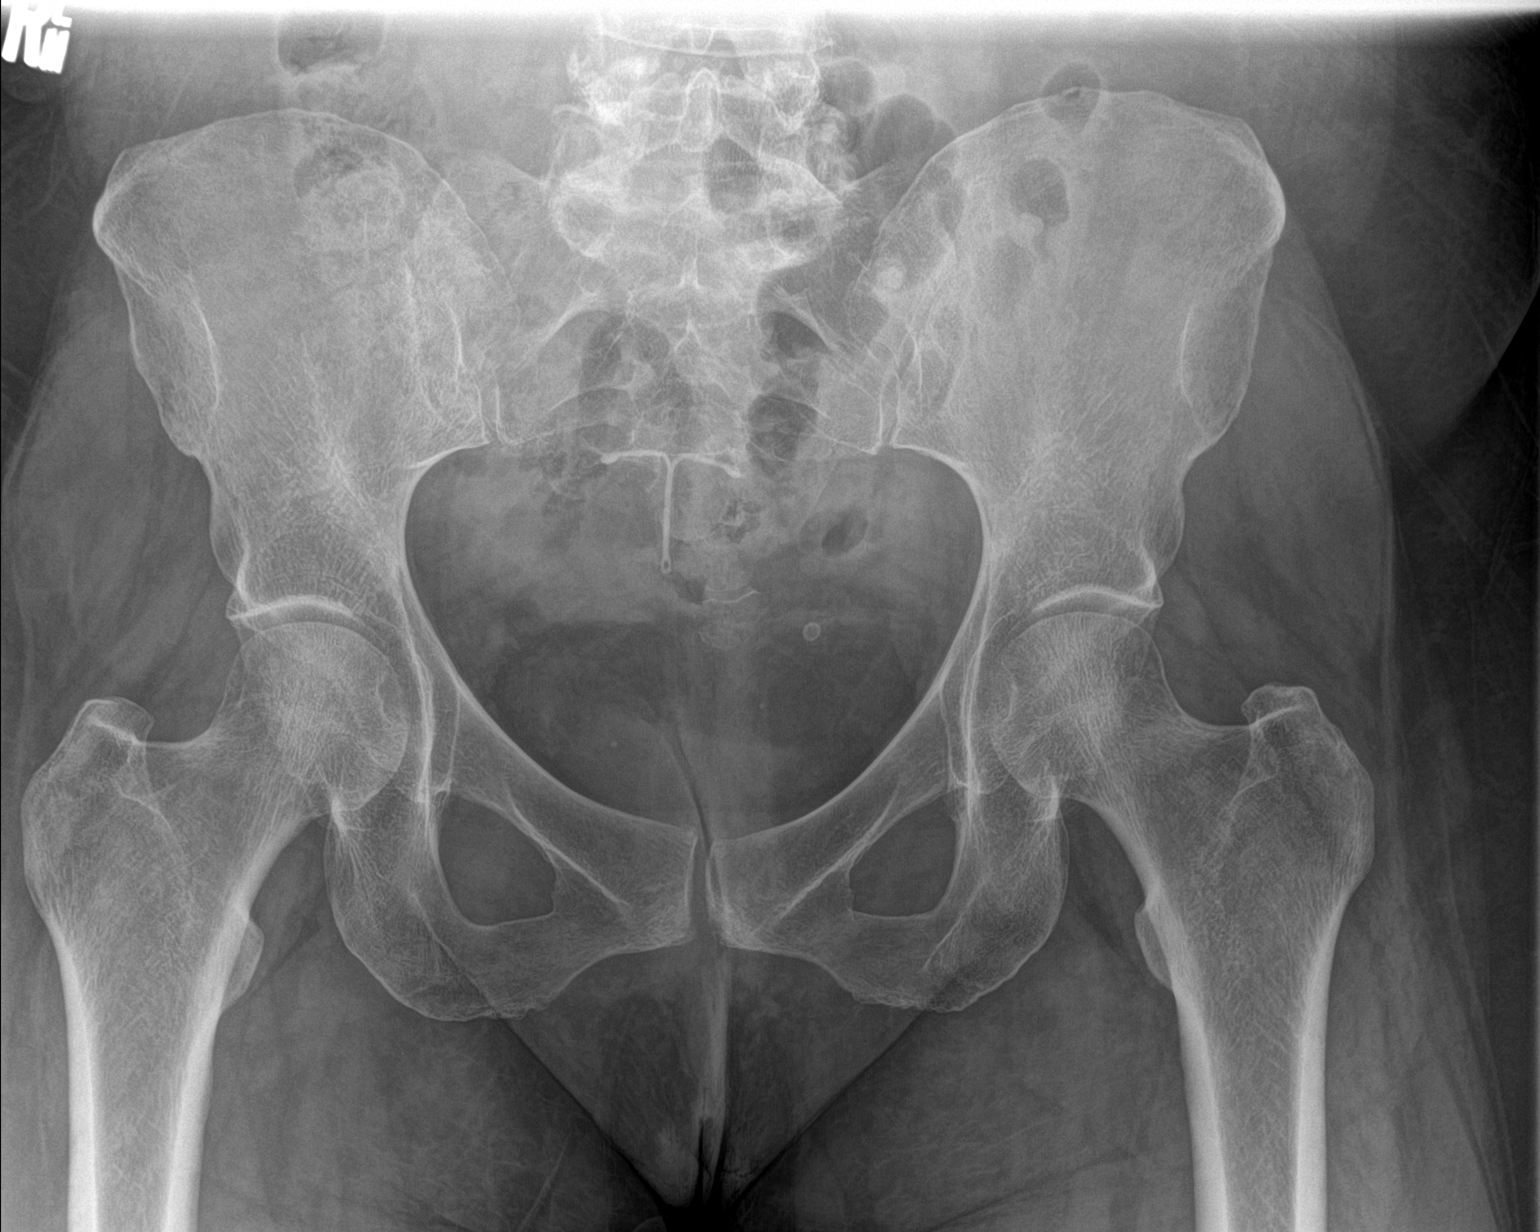

[1 of 1 positions shown; findings below may reference images not displayed]

FINDINGS: Pelvic ring is intact. No acute fracture or dislocation is noted. No
soft tissue abnormality is seen. An IUD is noted in place.
IMPRESSION: No acute abnormality noted.

## 2018-01-19 ENCOUNTER — Telehealth: Payer: Self-pay

## 2018-01-19 NOTE — Telephone Encounter (Signed)
-----   Message from Gae Dry, MD sent at 01/18/2018  8:02 AM EST ----- Regarding: MMG needed Received notice she has not received MMG yet as ordered at her Annual. Please check and encourage her to do this, and document conversation.

## 2018-01-19 NOTE — Telephone Encounter (Signed)
Left message to remind pt to schedule her mammo

## 2018-12-28 ENCOUNTER — Telehealth: Payer: Self-pay | Admitting: Obstetrics & Gynecology

## 2018-12-28 NOTE — Telephone Encounter (Signed)
Called pt and left VM with information

## 2018-12-28 NOTE — Telephone Encounter (Signed)
Needs to see Family Medicine or Internal Medicine Physician for long term management of hypertension.  With it this high today, she Should go to urgent care today for BP check.

## 2018-12-28 NOTE — Telephone Encounter (Signed)
Pt called stating her BP was "212/1something". She states you put her on BP meds before and it helped. She was interested in getting back on them but wanted to know if she needed to be seen or is it something you can just call back in for her. [phone]

## 2019-01-02 ENCOUNTER — Encounter: Payer: Self-pay | Admitting: Family Medicine

## 2019-01-02 ENCOUNTER — Other Ambulatory Visit
Admission: RE | Admit: 2019-01-02 | Discharge: 2019-01-02 | Disposition: A | Discharge status: Home / Self Care | Payer: BC Managed Care – PPO | Attending: Family Medicine | Admitting: Family Medicine

## 2019-01-02 ENCOUNTER — Ambulatory Visit: Payer: BC Managed Care – PPO | Admitting: Family Medicine

## 2019-01-02 ENCOUNTER — Other Ambulatory Visit: Payer: Self-pay

## 2019-01-02 VITALS — BP 168/112 | HR 74 | Temp 98.1°F | Resp 14 | Ht 67.5 in | Wt 201.3 lb

## 2019-01-02 DIAGNOSIS — Z1329 Encounter for screening for other suspected endocrine disorder: Secondary | ICD-10-CM | POA: Diagnosis present

## 2019-01-02 DIAGNOSIS — Z13228 Encounter for screening for other metabolic disorders: Secondary | ICD-10-CM | POA: Diagnosis present

## 2019-01-02 DIAGNOSIS — R519 Headache, unspecified: Secondary | ICD-10-CM | POA: Insufficient documentation

## 2019-01-02 DIAGNOSIS — Z23 Encounter for immunization: Secondary | ICD-10-CM

## 2019-01-02 DIAGNOSIS — I160 Hypertensive urgency: Secondary | ICD-10-CM

## 2019-01-02 DIAGNOSIS — Z130 Encounter for screening for diseases of the blood and blood-forming organs and certain disorders involving the immune mechanism: Secondary | ICD-10-CM | POA: Diagnosis present

## 2019-01-02 DIAGNOSIS — F439 Reaction to severe stress, unspecified: Secondary | ICD-10-CM

## 2019-01-02 DIAGNOSIS — E669 Obesity, unspecified: Secondary | ICD-10-CM | POA: Diagnosis not present

## 2019-01-02 DIAGNOSIS — I10 Essential (primary) hypertension: Secondary | ICD-10-CM | POA: Diagnosis not present

## 2019-01-02 DIAGNOSIS — R079 Chest pain, unspecified: Secondary | ICD-10-CM | POA: Insufficient documentation

## 2019-01-02 DIAGNOSIS — Z7689 Persons encountering health services in other specified circumstances: Secondary | ICD-10-CM

## 2019-01-02 DIAGNOSIS — Z6831 Body mass index (BMI) 31.0-31.9, adult: Secondary | ICD-10-CM

## 2019-01-02 LAB — COMPREHENSIVE METABOLIC PANEL
ALT: 18 U/L (ref 0–44)
AST: 21 U/L (ref 15–41)
Albumin: 4.3 g/dL (ref 3.5–5.0)
Alkaline Phosphatase: 62 U/L (ref 38–126)
Anion gap: 8 (ref 5–15)
BUN: 16 mg/dL (ref 6–20)
CO2: 24 mmol/L (ref 22–32)
Calcium: 9.4 mg/dL (ref 8.9–10.3)
Chloride: 108 mmol/L (ref 98–111)
Creatinine, Ser: 1.01 mg/dL — ABNORMAL HIGH (ref 0.44–1.00)
GFR calc Af Amer: >60 mL/min (ref >60)
GFR calc non Af Amer: >60 mL/min (ref >60)
Glucose, Bld: 104 mg/dL — ABNORMAL HIGH (ref 70–99)
Potassium: 3.5 mmol/L (ref 3.5–5.1)
Sodium: 140 mmol/L (ref 135–145)
Total Bilirubin: 1.1 mg/dL (ref 0.3–1.2)
Total Protein: 7.2 g/dL (ref 6.5–8.1)

## 2019-01-02 LAB — CBC WITH DIFFERENTIAL/PLATELET
Abs Immature Granulocytes: 0.01 10*3/uL (ref 0.00–0.07)
Basophils Absolute: 0 10*3/uL (ref 0.0–0.1)
Basophils Relative: 1 %
Eosinophils Absolute: 0 10*3/uL (ref 0.0–0.5)
Eosinophils Relative: 1 %
HCT: 36.3 % (ref 36.0–46.0)
Hemoglobin: 11.9 g/dL — ABNORMAL LOW (ref 12.0–15.0)
Immature Granulocytes: 0 %
Lymphocytes Relative: 48 %
Lymphs Abs: 2 10*3/uL (ref 0.7–4.0)
MCH: 29.7 pg (ref 26.0–34.0)
MCHC: 32.8 g/dL (ref 30.0–36.0)
MCV: 90.5 fL (ref 80.0–100.0)
Monocytes Absolute: 0.3 10*3/uL (ref 0.1–1.0)
Monocytes Relative: 7 %
Neutro Abs: 1.8 10*3/uL (ref 1.7–7.7)
Neutrophils Relative %: 43 %
Platelets: 217 10*3/uL (ref 150–400)
RBC: 4.01 MIL/uL (ref 3.87–5.11)
RDW: 13.2 % (ref 11.5–15.5)
WBC: 4.2 10*3/uL (ref 4.0–10.5)
nRBC: 0 % (ref 0.0–0.2)

## 2019-01-02 LAB — TROPONIN I (HIGH SENSITIVITY): Troponin I (High Sensitivity): 9 ng/L (ref <18)

## 2019-01-02 LAB — BRAIN NATRIURETIC PEPTIDE: B Natriuretic Peptide: 37 pg/mL (ref 0.0–100.0)

## 2019-01-02 LAB — TSH: TSH: 0.725 u[IU]/mL (ref 0.350–4.500)

## 2019-01-02 MED ORDER — LOSARTAN POTASSIUM-HCTZ 50-12.5 MG PO TABS: 1.0000 | ORAL_TABLET | Freq: Every day | ORAL | 3 refills | Status: DC

## 2019-01-02 MED ORDER — AMLODIPINE BESYLATE 10 MG PO TABS: 10.0000 mg | ORAL_TABLET | Freq: Every day | ORAL | 3 refills | Status: DC

## 2019-01-02 MED ORDER — LORAZEPAM 0.5 MG PO TABS: 0.2500 mg | ORAL_TABLET | Freq: Two times a day (BID) | ORAL | 0 refills | Status: DC | PRN

## 2019-01-02 NOTE — Patient Instructions (Addendum)
Go get labs, wait for the results and I will call you with what to do this afternoon  Also get your Chest Xray  I sent in 2 blood pressure meds

## 2019-01-02 NOTE — Progress Notes (Signed)
Name: April Cross   MRN: [id number]    DOB: 04-19-1960   Date:01/02/2019       Progress Note  Chief Complaint  Patient presents with   Establish Care   Hypertension    bp running high at work sent to urgent care ekg normal did not start on meds     Subjective:   April Cross is a 58 y.o. female, presents to clinic to establish care and for severely elevated blood pressure. Last Wednesday patient states that she started to "feel different."  She checked her blood pressure at home, it "felt like when her blood pressure is high" and her blood pressure was elevated to 222/120.  She did have a slight HA and some chest discomfort.  She denies any particular quality of chest discomfort denied it being a pressure or tightness was not sharp she just describes as a funny feeling.  Like when she has acid reflux and it gets a little uncomfortable.  Her headache was mild was generalized without any visual disturbances, eye pain, nausea, vomiting, numbness, tingling, slurred speech or confusion.  She reports her sister is a doctor and had encouraged her to stay home from work and rest and also to go seek evaluation in urgent care.  She did go to a local urgent care who did a EKG, screened her urine, check her blood sugar and then told her she needed to follow-up with PCP because they did not want to "drop her blood pressure too fast" she brings in medical record with her but there is not a copy of the EKG patient reports that they said it was fine.   That same day and is several days following, she stayed at home try to rest and relax as much as she could.  Her blood pressure did gradually decrease and her headache and chest discomfort improved but she did feel extremely fatigued and has since that time.   She has been trying to eat very healthy, she has been resting. There has been a lot of increased stress at work.  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  There is a lot of criticism or correction and then you go back in the next day is like nothing happened and she feels a wheeze constantly ready for conflict this is carding a lot of stress for her and she believes it is driving some of her symptoms. Patient further denies any associated palpitations, orthopnea, PND, lower extremity edema, near syncope, lightheadedness, dizziness, nausea, vomiting, abdominal pain, back pain   Patient Active Problem List   Diagnosis Date Noted   Special screening for malignant neoplasms, colon    Annual physical exam 05/21/2017   Obesity (BMI 30-39.9) 05/21/2017    Past Surgical History:  Procedure Laterality Date   COLONOSCOPY WITH PROPOFOL N/A 07/08/2017   Procedure: COLONOSCOPY WITH PROPOFOL;  Surgeon: Lin Landsman, MD;  Location: [facility] ENDOSCOPY;  Service: Endoscopy;  Laterality: N/A;   WRIST SURGERY      Family History  Problem Relation Age of Onset   Brain cancer Paternal Uncle    Lung cancer Paternal Uncle    Leukemia Maternal Grandmother    Ovarian cancer Maternal Grandmother    Breast cancer Paternal Grandfather    Diabetes Mother    Hypertension Mother    Colon cancer Sister    Thyroid disease Sister    Asthma Son     Social History  Socioeconomic History   Marital status: Single    Spouse name: Not on file   Number of children: 2   Years of education: 65   Highest education level: Associate degree: academic program  Occupational History   Not on file  Social Needs   Financial resource strain: Not hard at all   Food insecurity    Worry: Never true    Inability: Never true   Transportation needs    Medical: No    Non-medical: No  Tobacco Use   Smoking status: Never Smoker   Smokeless tobacco: Never Used  Substance and Sexual Activity   Alcohol use: No    Frequency: Never   Drug use: No   Sexual activity: Not Currently  Lifestyle   Physical activity    Days per week: 0  days    Minutes per session: 0 min   Stress: Not at all  Relationships   Social connections    Talks on phone: More than three times a week    Gets together: More than three times a week    Attends religious service: More than 4 times per year    Active member of club or organization: No    Attends meetings of clubs or organizations: Never    Relationship status: Not on file   Intimate partner violence    Fear of current or ex partner: No    Emotionally abused: No    Physically abused: No    Forced sexual activity: No  Other Topics Concern   Not on file  Social History Narrative   Not on file     Current Outpatient Medications:    cholecalciferol (VITAMIN D3) 25 MCG (1000 UT) tablet, Take 1,000 Units by mouth daily., Disp: , Rfl:    levonorgestrel (MIRENA) 20 MCG/24HR IUD, 1 each by Intrauterine route once., Disp: , Rfl:    methocarbamol (ROBAXIN) 500 MG tablet, TAKE 1 (ONE) TABLET EVERY SIX HOURS, AS NEEDED FOR SPASM, Disp: , Rfl: 0   methylPREDNISolone (MEDROL DOSEPAK) 4 MG TBPK tablet, TAKE 6 TABLETS ON DAY 1 AS DIRECTED ON PACKAGE AND DECREASE BY 1 TAB EACH DAY FOR A TOTAL OF 6 DAYS, Disp: , Rfl: 0   naproxen (NAPROSYN) 500 MG tablet, TAKE 1 TABLET BY MOUTH 2 TIMES DAILY AS NEEDED, Disp: , Rfl: 0  Allergies  Allergen Reactions   Penicillins     I personally reviewed active problem list, medication list, allergies, family history, social history, health maintenance, notes from last encounter, lab results, imaging with the patient/caregiver today.  Review of Systems  Constitutional: Positive for fatigue. Negative for activity change, appetite change, chills, diaphoresis, fever and unexpected weight change.  HENT: Negative.   Eyes: Negative.   Respiratory: Negative.  Negative for cough, choking, chest tightness, shortness of breath, wheezing and stridor.   Cardiovascular: Negative.  Negative for palpitations and leg swelling.  Gastrointestinal: Negative.     Endocrine: Negative.   Genitourinary: Negative.   Musculoskeletal: Negative.   Skin: Negative.   Allergic/Immunologic: Negative.   Neurological: Negative.   Hematological: Negative.   Psychiatric/Behavioral: Negative.   All other systems reviewed and are negative.    Objective:    Vitals:   01/02/19 1316  BP: (!) 178/110  Pulse: (!) 103  Resp: 14  Temp: 98.1 F (36.7 C)  SpO2: 98%  Weight: 201 lb 4.8 oz (91.3 kg)  Height: 5' 7.5" (1.715 m)    Body mass index is 31.06 kg/m.  Physical Exam  Vitals signs and nursing note reviewed.  Constitutional:      General: She is not in acute distress.    Appearance: Normal appearance. She is well-developed. She is obese. She is not ill-appearing, toxic-appearing or diaphoretic.  HENT:     Head: Normocephalic and atraumatic.     Right Ear: External ear normal.     Left Ear: External ear normal.     Nose: Nose normal.     Mouth/Throat:     Mouth: Mucous membranes are moist.     Pharynx: Oropharynx is clear. Uvula midline.  Eyes:     General: Lids are normal. No scleral icterus.       Right eye: No discharge.        Left eye: No discharge.     Extraocular Movements: Extraocular movements intact.     Conjunctiva/sclera: Conjunctivae normal.     Pupils: Pupils are equal, round, and reactive to light.  Neck:     Musculoskeletal: Normal range of motion and neck supple.     Vascular: No carotid bruit.     Trachea: Phonation normal. No tracheal deviation.  Cardiovascular:     Rate and Rhythm: Normal rate and regular rhythm.     Pulses: Normal pulses.          Radial pulses are 2+ on the right side and 2+ on the left side.       Posterior tibial pulses are 2+ on the right side and 2+ on the left side.     Heart sounds: Normal heart sounds. No murmur. No friction rub. No gallop.   Pulmonary:     Effort: Pulmonary effort is normal. No respiratory distress.     Breath sounds: Normal breath sounds. No stridor. No wheezing, rhonchi  or rales.  Chest:     Chest wall: No tenderness.  Abdominal:     General: Bowel sounds are normal. There is no distension.     Palpations: Abdomen is soft.     Tenderness: There is no abdominal tenderness. There is no guarding or rebound.  Musculoskeletal: Normal range of motion.        General: No deformity.     Right lower leg: No edema.     Left lower leg: No edema.  Lymphadenopathy:     Cervical: No cervical adenopathy.  Skin:    General: Skin is warm and dry.     Capillary Refill: Capillary refill takes less than 2 seconds.     Coloration: Skin is not jaundiced or pale.     Findings: No rash.  Neurological:     General: No focal deficit present.     Mental Status: She is alert and oriented to person, place, and time.     Cranial Nerves: No cranial nerve deficit.     Sensory: No sensory deficit.     Motor: No weakness or abnormal muscle tone.     Coordination: Coordination normal.     Gait: Gait normal.     Deep Tendon Reflexes: Reflexes normal.  Psychiatric:        Attention and Perception: Attention normal.        Mood and Affect: Affect normal. Mood is anxious.        Speech: Speech normal. Speech is not slurred.        Behavior: Behavior normal. Behavior is cooperative.        Thought Content: Thought content normal.      ECG interpretation   Date: 01/04/19  Rate: 87  Rhythm: normal sinus rhythm  QRS Axis: normal  Intervals: normal  ST/T Wave abnormalities: no ST elevation or depression  Conduction Disutrbances: none  Narrative Interpretation:   "sinus rhythm Anterior T wave changes are nonspecific"  Old EKG Reviewed: - no prior EKG available to review - requesting from [facility] to compare   No results found for this or any previous visit (from the past 2160 hour(s)).  PHQ2/9: Depression screen PHQ 2/9 01/02/2019  Decreased Interest 0  Down, Depressed, Hopeless 2  PHQ - 2 Score 2  Altered sleeping 3  Tired, decreased energy 3  Change in appetite 0   Feeling bad or failure about yourself  0  Trouble concentrating 0  Moving slowly or fidgety/restless 0  Suicidal thoughts 0  PHQ-9 Score 8  Difficult doing work/chores Not difficult at all    phq 9 is Positive - discussed stress, anxiety and mood - initiating med tx as noted in A&P  Fall Risk: Fall Risk  01/02/2019  Falls in the past year? 0  Number falls in past yr: 0  Injury with Fall? 0   Functional Status Survey: Is the patient deaf or have difficulty hearing?: No Does the patient have difficulty seeing, even when wearing glasses/contacts?: No Does the patient have difficulty concentrating, remembering, or making decisions?: No Does the patient have difficulty walking or climbing stairs?: No Does the patient have difficulty dressing or bathing?: No Does the patient have difficulty doing errands alone such as visiting a doctor's office or shopping?: No  Assessment & Plan:    New pt here to est care, presents with HTN urgency x 5 days BP range 160-230/110-120's      ICD-10-CM   1. Hypertensive urgency  I16.0 DG Chest 2 View    Brain natriuretic peptide    CBC with Differential/Platelet    Comprehensive Metabolic Panel (CMET)    Troponin I (High Sensitivity)    TSH    CANCELED: Brain natriuretic peptide    CANCELED: TSH    CANCELED: Troponin I (High Sensitivity)    CANCELED: CBC with Differential/Platelet    CANCELED: Comprehensive Metabolic Panel (CMET)   some intermittent elevated BP in the past but no meds.  UC eval 5 days ago, records reviewed, recheck urine, kidney, EKG -  Patient was having some symptoms 5 days ago they have gradually improved but her blood pressure still remains elevated.  I am concerned that blood pressure is as high as it is would like to rule out any type of event and recheck for endorgan damage.  Patient appears well she is not having any change to her urination, no active chest pain, no exertional symptoms no respiratory symptoms, no signs of  stroke or TIA EKG done in clinic and labs will be stat for her to do at the hospital and wait for results in case there is any need to go to the ER  R/o ischemia/infarct with stat trop - unlikely with hx but with BP and gender would like neg trop to r/o ACS as cause of BP/CP/fatigue BNP to assess for HF/fluid overload - although clinically pt appears euvolemic Checking renal function, liver function, assessing for protein in urine, thyroid dysfunction as etiology?     2. Chest pain, unspecified type  R07.9 DG Chest 2 View    Brain natriuretic peptide    CBC with Differential/Platelet    Comprehensive Metabolic Panel (CMET)    Troponin I (High Sensitivity)    TSH  EKG 12-Lead    CANCELED: Brain natriuretic peptide    CANCELED: TSH    CANCELED: Troponin I (High Sensitivity)    CANCELED: CBC with Differential/Platelet    CANCELED: Comprehensive Metabolic Panel (CMET)   Onset 5 days ago with associated elevated blood pressure, is not worse with exertion, no respiratory sx, UC eval showed normal UA, CBG and EKG  EKG reassuring here today, see above for CP MDM - may also be anxiety/stress or GERD   3. Essential hypertension  I10 amLODipine (NORVASC) 10 MG tablet    losartan-hydrochlorothiazide (HYZAAR) 50-12.5 MG tablet    Comprehensive Metabolic Panel (CMET)    EKG 12-Lead  Will need to tx BP after reviewing labs - they did result before I left clinic at end of business day - good renal function - losartan for renal protection, norvasc for very high diastolic pressure  Continue to monitor at home, Red flags reviewed with the pt  If difficult to control with new meds, and avoiding stress/triggers etc will have her see cardiology for consult    CANCELED: Comprehensive Metabolic Panel (CMET)  4. Intractable headache, unspecified chronicity pattern, unspecified headache type  R51.9 Brain natriuretic peptide    CBC with Differential/Platelet    Comprehensive Metabolic Panel (CMET)     Troponin I (High Sensitivity)    TSH    CANCELED: Brain natriuretic peptide    CANCELED: TSH    CANCELED: Troponin I (High Sensitivity)    CANCELED: CBC with Differential/Platelet  No focal neurological deficit  CANCELED: Comprehensive Metabolic Panel (CMET)  5. Situational stress   In light of contention/work changes and COVID pandemic - pt works in [employer] - a considerable amount of stress -I have written her a letter asking that she be able to continue to work remotely at this time since she is able to fully perform her job functions from home, hopefully will allow this over the next 2 to 3 weeks while we get her blood pressure under control, would like to treat and control the element of stress and/or anxiety in order to get appropriate medical management for her hypertension at her baseline  Patient has also been stressed because of a new announcement from her employer about how all examinations are ending and employees need to take leave or return to work in person in the next few weeks   F43.9 LORazepam (ATIVAN) 0.5 MG tablet  6. Need for influenza vaccination   Done today Z23 Flu Vaccine QUAD 6+ mos PF IM (Fluarix Quad PF)  7. Class 1 obesity with serious comorbidity and body mass index (BMI) of 31.0 to 31.9 in adult, unspecified obesity type   Added to problem list, will do well visit when she is feeling better but for now will screen A1c with increased BMI and is a risk factor of cardiovascular health E66.9 Hemoglobin A1c   Z68.31 CANCELED: Hemoglobin A1c  8. Screening for endocrine, metabolic and immunity disorder  Z13.29 CBC with Differential/Platelet   Z13.228 Comprehensive Metabolic Panel (CMET)   [id number] TSH    Hemoglobin A1c    CANCELED: TSH    CANCELED: CBC with Differential/Platelet    CANCELED: Comprehensive Metabolic Panel (CMET)    CANCELED: Hemoglobin A1c  9. Encounter to establish care with new doctor -reviewed available records, will continue to update  chart as records are received Z76.89     Did have the patient go to [facility] lab for stat labs due to her very elevated blood pressure over the  past several days her EKG was unremarkable, will order stat troponin, BNP and CMP  4:58 PM Did receive lab results and called the patient to review cardiac enzyme was negative, BNP was negative good kidney function thyroid was normal mild anemia and otherwise unremarkable  They told her to leave the lab today and she did not get her chest x-ray done so she will do that tomorrow She was instructed to take Norvasc and losartan conversing in the morning for the next 2 weeks try to do her work from home and avoid stressful environment and just relax work on heart healthy diet and exercise. Did encourage her to check her blood pressure if she had any symptoms including headache, visual disturbances, chest pain, chest pressure, difficulty breathing, palpitations or near syncope  Pt asked to f/up in 2-3 weeks, asked to monitor BP at home and f/up sooner if >160/100, <100/60 or side effects or concerns  Greater than 50% of this visit was spent in direct face-to-face counseling, obtaining history and physical, discussing and educating pt on treatment plan.  Total time of this visit was >60 min 9pt here from 1300 to 1430, then went for stat labs and results and plan were personally called to her and reviewed later in the afternoon at 4:58 pm.  Remainder of time involved but was not limited to reviewing chart (recent and pertinent OV notes and labs), documentation in EMR, and coordinating care and treatment plan.   Delsa Grana, PA-C 01/02/19 1:36 PM

## 2019-01-03 ENCOUNTER — Telehealth: Payer: Self-pay | Admitting: Family Medicine

## 2019-01-03 LAB — HEMOGLOBIN A1C
Hgb A1c MFr Bld: 5.5 % (ref 4.8–5.6)
Mean Plasma Glucose: 111 mg/dL

## 2019-01-03 NOTE — Telephone Encounter (Signed)
Pt called, explaining that she submitted to the doctor's note for work she received to the wrong person. She is requesting that the office provide her with another note and also fax it over to her employer. Fax: [phone] Attention: Malachy Mood  Email: [email or web address]

## 2019-01-03 NOTE — Telephone Encounter (Signed)
Pt notified and faxed

## 2019-01-09 ENCOUNTER — Ambulatory Visit: Payer: Self-pay | Admitting: Family Medicine

## 2019-01-16 ENCOUNTER — Encounter: Payer: Self-pay | Admitting: Family Medicine

## 2019-01-16 ENCOUNTER — Other Ambulatory Visit: Payer: Self-pay

## 2019-01-16 ENCOUNTER — Ambulatory Visit: Payer: BC Managed Care – PPO | Admitting: Family Medicine

## 2019-01-16 VITALS — BP 118/82 | HR 88 | Temp 97.1°F | Resp 14 | Ht 68.0 in | Wt 199.5 lb

## 2019-01-16 DIAGNOSIS — M62838 Other muscle spasm: Secondary | ICD-10-CM

## 2019-01-16 DIAGNOSIS — I160 Hypertensive urgency: Secondary | ICD-10-CM | POA: Diagnosis not present

## 2019-01-16 DIAGNOSIS — F419 Anxiety disorder, unspecified: Secondary | ICD-10-CM

## 2019-01-16 MED ORDER — METHOCARBAMOL 500 MG PO TABS: 500.0000 mg | ORAL_TABLET | Freq: Three times a day (TID) | ORAL | 1 refills | Status: DC | PRN

## 2019-01-16 NOTE — Progress Notes (Signed)
Patient ID: April Cross, female    DOB: [birth year], 58 y.o.   MRN: [id number]  PCP: April Grana, PA-C  Chief Complaint  Patient presents with   Follow-up   Hypertension    Subjective:   April Cross is a 58 y.o. female, presents to clinic with CC of the following:  Blood pressure follow-up and anxiety follow-up Hypertension:   New pt just started BP meds Currently managed on losartan-HCTZ 50-12.5, amlodipine 10 mg  Pt reports good med compliance She thinks amlodipine causes some tachycardia, she does feel her heart beat faster about 1/2-hour after she takes it. HCTZ is causing her to urinate a lot more - some muscle cramps which she's had before and has been similar to to past muscle cramps she has been trying to hydrate No lightheadedness, hypotension, syncope. Blood pressure today is well controlled.  118/82 HR is 88 today at the time of exam  BP Readings from Last 3 Encounters:  01/16/19 118/82  01/02/19 (!) 168/112  11/29/17 (!) 140/98   Pt denies CP, SOB, exertional sx, LE edema, palpitation, Ha's, visual disturbances   Anxiety: Patient complains of anxiety disorder and panic attacks.  She has the following symptoms: difficulty concentrating, feelings of losing control, insomnia, irritable, palpitations. Onset of symptoms was approximately several months ago, gradually improving since that time. She denies current suicidal and homicidal ideation.  No family hx of anxiety or depression, sister had some mood changes after her mother passed Sleeping well, she does have to try and relax Did grief counseling in the past -around the time of anniversaries of her parents deaths but she does not feel down not feel like she has panic attacks or anxiety related to that and she is slightly hesitant to start medications for anxiety because she does not believe that she is depressed. She has been using ativan twice a day, her son commented that "thank  goodness..." and " about time" someone gave her something for her anxiety.  She did not previously think her anxiety was an issue she initially reported to me that she became anxious with a lot of increase stress at work, now talking to her family members and her children she does state that she is slightly prone to anxiety is very high energy has to be very physically active but she also has some agitation, restlessness, difficulty sleeping and does tend to worry. She asked me to sign papers to allow her now to return to work.  She states that she has stepped down from a lot of her work duties, believes it will be much less stress for her.   Patient Active Problem List   Diagnosis Date Noted   Special screening for malignant neoplasms, colon    Annual physical exam 05/21/2017   Obesity (BMI 30-39.9) 05/21/2017      Current Outpatient Medications:    amLODipine (NORVASC) 10 MG tablet, Take 1 tablet (10 mg total) by mouth daily., Disp: 90 tablet, Rfl: 3   cholecalciferol (VITAMIN D3) 25 MCG (1000 UT) tablet, Take 1,000 Units by mouth daily., Disp: , Rfl:    levonorgestrel (MIRENA) 20 MCG/24HR IUD, 1 each by Intrauterine route once., Disp: , Rfl:    LORazepam (ATIVAN) 0.5 MG tablet, Take 0.5-1 tablets (0.25-0.5 mg total) by mouth 2 (two) times daily as needed for anxiety., Disp: 20 tablet, Rfl: 0   losartan-hydrochlorothiazide (HYZAAR) 50-12.5 MG tablet, Take 1 tablet by mouth daily., Disp: 90 tablet, Rfl: 3   methocarbamol (  ROBAXIN) 500 MG tablet, TAKE 1 (ONE) TABLET EVERY SIX HOURS, AS NEEDED FOR SPASM, Disp: , Rfl: 0   methylPREDNISolone (MEDROL DOSEPAK) 4 MG TBPK tablet, TAKE 6 TABLETS ON DAY 1 AS DIRECTED ON PACKAGE AND DECREASE BY 1 TAB EACH DAY FOR A TOTAL OF 6 DAYS, Disp: , Rfl: 0   naproxen (NAPROSYN) 500 MG tablet, TAKE 1 TABLET BY MOUTH 2 TIMES DAILY AS NEEDED, Disp: , Rfl: 0   Allergies  Allergen Reactions   Penicillins      Family History  Problem Relation Age of  Onset   Brain cancer Paternal Uncle    Lung cancer Paternal Uncle    Leukemia Maternal Grandmother    Ovarian cancer Maternal Grandmother    Breast cancer Paternal Grandfather    Diabetes Mother    Hypertension Mother    Colon cancer Sister    Thyroid disease Sister    Asthma Son      Social History   Socioeconomic History   Marital status: Single    Spouse name: Not on file   Number of children: 2   Years of education: 12   Highest education level: Associate degree: academic program  Occupational History   Not on file  Social Needs   Financial resource strain: Not hard at all   Food insecurity    Worry: Never true    Inability: Never true   Transportation needs    Medical: No    Non-medical: No  Tobacco Use   Smoking status: Never Smoker   Smokeless tobacco: Never Used  Substance and Sexual Activity   Alcohol use: No    Frequency: Never   Drug use: No   Sexual activity: Not Currently  Lifestyle   Physical activity    Days per week: 0 days    Minutes per session: 0 min   Stress: Not at all  Relationships   Social connections    Talks on phone: More than three times a week    Gets together: More than three times a week    Attends religious service: More than 4 times per year    Active member of club or organization: No    Attends meetings of clubs or organizations: Never    Relationship status: Not on file   Intimate partner violence    Fear of current or ex partner: No    Emotionally abused: No    Physically abused: No    Forced sexual activity: No  Other Topics Concern   Not on file  Social History Narrative   Not on file    I personally reviewed allergies, meds, past medical history, recent lab results with the patient/caregiver today.  Review of Systems  Constitutional: Negative.   HENT: Negative.   Eyes: Negative.   Respiratory: Negative.   Cardiovascular: Negative.   Gastrointestinal: Negative.   Endocrine:  Negative.   Genitourinary: Negative.   Musculoskeletal: Negative.   Skin: Negative.   Allergic/Immunologic: Negative.   Neurological: Negative.   Hematological: Negative.   Psychiatric/Behavioral: Negative.   All other systems reviewed and are negative.      Objective:   Vitals:   01/16/19 1129  Pulse: (!) 106  Resp: 14  Temp: (!) 97.1 F (36.2 C)  SpO2: 99%  Weight: 199 lb 8 oz (90.5 kg)  Height: 5\' 8"  (1.727 m)    Body mass index is 30.33 kg/m.  Physical Exam Vitals signs and nursing note reviewed.  Constitutional:  General: She is not in acute distress.    Appearance: Normal appearance. She is well-developed. She is not ill-appearing, toxic-appearing or diaphoretic.     Interventions: Face mask in place.  HENT:     Head: Normocephalic and atraumatic.     Right Ear: External ear normal.     Left Ear: External ear normal.  Eyes:     General: Lids are normal. No scleral icterus.       Right eye: No discharge.        Left eye: No discharge.     Conjunctiva/sclera: Conjunctivae normal.  Neck:     Musculoskeletal: Normal range of motion and neck supple.     Trachea: Phonation normal. No tracheal deviation.  Cardiovascular:     Rate and Rhythm: Normal rate and regular rhythm.     Pulses: Normal pulses.          Radial pulses are 2+ on the right side and 2+ on the left side.       Posterior tibial pulses are 2+ on the right side and 2+ on the left side.     Heart sounds: Normal heart sounds. No murmur. No friction rub. No gallop.   Pulmonary:     Effort: Pulmonary effort is normal. No respiratory distress.     Breath sounds: Normal breath sounds. No stridor. No wheezing, rhonchi or rales.  Chest:     Chest wall: No tenderness.  Abdominal:     General: Bowel sounds are normal. There is no distension.     Palpations: Abdomen is soft.     Tenderness: There is no abdominal tenderness. There is no guarding or rebound.  Musculoskeletal: Normal range of motion.          General: No deformity.     Right lower leg: No edema.     Left lower leg: No edema.  Lymphadenopathy:     Cervical: No cervical adenopathy.  Skin:    General: Skin is warm and dry.     Capillary Refill: Capillary refill takes less than 2 seconds.     Coloration: Skin is not jaundiced or pale.     Findings: No rash.  Neurological:     Mental Status: She is alert and oriented to person, place, and time.     Motor: No abnormal muscle tone.     Gait: Gait normal.  Psychiatric:        Attention and Perception: She is inattentive.        Mood and Affect: Mood is anxious.        Speech: Speech is rapid and pressured and tangential. Speech is not slurred.        Behavior: Behavior is hyperactive. Behavior is not agitated. Behavior is cooperative.     Comments: Very fidgety and hyperactive, visibly nervous, talkative      Results for orders placed or performed during the hospital encounter of 01/02/19  Hemoglobin A1c  Result Value Ref Range   Hgb A1c MFr Bld 5.5 4.8 - 5.6 %   Mean Plasma Glucose 111 mg/dL  TSH  Result Value Ref Range   TSH 0.725 0.350 - 4.500 uIU/mL  Comprehensive Metabolic Panel (CMET)  Result Value Ref Range   Sodium 140 135 - 145 mmol/L   Potassium 3.5 3.5 - 5.1 mmol/L   Chloride 108 98 - 111 mmol/L   CO2 24 22 - 32 mmol/L   Glucose, Bld 104 (H) 70 - 99 mg/dL   BUN 16 6 -  20 mg/dL   Creatinine, Ser 1.01 (H) 0.44 - 1.00 mg/dL   Calcium 9.4 8.9 - 10.3 mg/dL   Total Protein 7.2 6.5 - 8.1 g/dL   Albumin 4.3 3.5 - 5.0 g/dL   AST 21 15 - 41 U/L   ALT 18 0 - 44 U/L   Alkaline Phosphatase 62 38 - 126 U/L   Total Bilirubin 1.1 0.3 - 1.2 mg/dL   GFR calc non Af Amer >60 >60 mL/min   GFR calc Af Amer >60 >60 mL/min   Anion gap 8 5 - 15  CBC with Differential/Platelet  Result Value Ref Range   WBC 4.2 4.0 - 10.5 K/uL   RBC 4.01 3.87 - 5.11 MIL/uL   Hemoglobin 11.9 (L) 12.0 - 15.0 g/dL   HCT 36.3 36.0 - 46.0 %   MCV 90.5 80.0 - 100.0 fL   MCH 29.7 26.0 -  34.0 pg   MCHC 32.8 30.0 - 36.0 g/dL   RDW 13.2 11.5 - 15.5 %   Platelets 217 150 - 400 K/uL   nRBC 0.0 0.0 - 0.2 %   Neutrophils Relative % 43 %   Neutro Abs 1.8 1.7 - 7.7 K/uL   Lymphocytes Relative 48 %   Lymphs Abs 2.0 0.7 - 4.0 K/uL   Monocytes Relative 7 %   Monocytes Absolute 0.3 0.1 - 1.0 K/uL   Eosinophils Relative 1 %   Eosinophils Absolute 0.0 0.0 - 0.5 K/uL   Basophils Relative 1 %   Basophils Absolute 0.0 0.0 - 0.1 K/uL   Immature Granulocytes 0 %   Abs Immature Granulocytes 0.01 0.00 - 0.07 K/uL  Brain natriuretic peptide  Result Value Ref Range   B Natriuretic Peptide 37.0 0.0 - 100.0 pg/mL  Troponin I (High Sensitivity)  Result Value Ref Range   Troponin I (High Sensitivity) 9 <18 ng/L        Assessment & Plan:      ICD-10-CM   1. Hypertensive urgency  I16.0    BP much better - some cramps so will check electrolytes and renal function, may be able to hold norvasc or take half dose  2. Muscle spasm  M62.838 methocarbamol (ROBAXIN) 500 MG tablet    BASIC METABOLIC PANEL WITH GFR    Magnesium   refill of robaxin, checking labs for electrolyte derangements  3. Anxiety disorder, unspecified type  F41.9    trial of zoloft - pt very anxious, fidgity, nervous in the room, feel benefits of meds would outweight SE, she used ativan BID which concerns me    Discussed benzos and how pt will need a daily controlling med and benzos should be used very sparingly.  Pt asks for a note to return to work.  BP is well controlled, she has stepped down from several responsibilities at work, she feels like she can cope with it better now.  She is determined to work on her health and leadership at her job are supportive.  Starting zoloft 25 mg at bedtime - 6 week f/up, hope it will help with insomnia, nervousness, physical sx.  Discussed SE, and pt agreed to starting.  6 week f/up    April Grana, PA-C 01/16/19 11:32 AM

## 2019-01-16 NOTE — Patient Instructions (Addendum)
Zoloft 25 mg take about an hour before bedtime.

## 2019-01-17 LAB — BASIC METABOLIC PANEL WITH GFR
BUN: 20 mg/dL (ref 7–25)
CO2: 29 mmol/L (ref 20–32)
Calcium: 10.1 mg/dL (ref 8.6–10.4)
Chloride: 105 mmol/L (ref 98–110)
Creat: 0.97 mg/dL (ref 0.50–1.05)
GFR, Est African American: 76 mL/min/{1.73_m2} (ref >=60)
GFR, Est Non African American: 65 mL/min/{1.73_m2} (ref >=60)
Glucose, Bld: 98 mg/dL (ref 65–99)
Potassium: 3.9 mmol/L (ref 3.5–5.3)
Sodium: 140 mmol/L (ref 135–146)

## 2019-01-17 LAB — MAGNESIUM: Magnesium: 1.7 mg/dL (ref 1.5–2.5)

## 2019-01-20 ENCOUNTER — Other Ambulatory Visit: Payer: Self-pay | Admitting: Family Medicine

## 2019-01-20 MED ORDER — VITAMIN D (ERGOCALCIFEROL) 1.25 MG (50000 UNIT) PO CAPS: 50000.0000 [IU] | ORAL_CAPSULE | ORAL | 0 refills | Status: DC

## 2019-02-27 ENCOUNTER — Encounter: Payer: Self-pay | Admitting: Family Medicine

## 2019-02-27 ENCOUNTER — Ambulatory Visit (INDEPENDENT_AMBULATORY_CARE_PROVIDER_SITE_OTHER): Payer: BC Managed Care – PPO | Admitting: Family Medicine

## 2019-02-27 VITALS — BP 138/100 | Ht 67.0 in | Wt 199.0 lb

## 2019-02-27 DIAGNOSIS — F4320 Adjustment disorder, unspecified: Secondary | ICD-10-CM

## 2019-02-27 DIAGNOSIS — F411 Generalized anxiety disorder: Secondary | ICD-10-CM

## 2019-02-27 DIAGNOSIS — F439 Reaction to severe stress, unspecified: Secondary | ICD-10-CM

## 2019-02-27 DIAGNOSIS — I10 Essential (primary) hypertension: Secondary | ICD-10-CM

## 2019-02-27 DIAGNOSIS — Z7189 Other specified counseling: Secondary | ICD-10-CM

## 2019-02-27 MED ORDER — LORAZEPAM 0.5 MG PO TABS: 0.2500 mg | ORAL_TABLET | Freq: Two times a day (BID) | ORAL | 0 refills | Status: DC | PRN

## 2019-02-27 MED ORDER — SERTRALINE HCL 50 MG PO TABS: ORAL_TABLET | ORAL | 3 refills | Status: DC

## 2019-02-27 NOTE — Progress Notes (Signed)
Name: April Cross   MRN: [id number]    DOB: [birth year]   Date:02/27/2019       Progress Note  Subjective:    Chief Complaint  Chief Complaint  Patient presents with  . Follow-up  . Depression  . Hypertension    I connected with  April Cross  on 02/27/19 at  2:20 PM EST by a video enabled telemedicine application and verified that I am speaking with the correct person using two identifiers.  I discussed the limitations of evaluation and management by telemedicine and the availability of in person appointments. The patient expressed understanding and agreed to proceed. Staff also discussed with the patient that there may be a patient responsible charge related to this service. Patient Location: at work [employer]  Provider Location: [facility] Additional Individuals present: none   Hypertension This is a chronic problem. The problem is uncontrolled. Associated symptoms include anxiety. Pertinent negatives include no blurred vision, chest pain, headaches, malaise/fatigue, neck pain, orthopnea, palpitations, peripheral edema, PND, shortness of breath or sweats. There are no associated agents to hypertension. The current treatment provides significant improvement. There are no compliance problems.   Anxiety Presents for follow-up visit. Symptoms include excessive worry, insomnia, nervous/anxious behavior, panic and restlessness. Patient reports no chest pain, compulsions, confusion, decreased concentration, depressed mood, dizziness, dry mouth, feeling of choking, hyperventilation, impotence, irritability, malaise, muscle tension, obsessions, palpitations, shortness of breath or suicidal ideas. Symptoms occur most days. The severity of symptoms is severe.   Compliance with medications: did not start zoloft, but almost out of ativan.  Unfortunately did not get Zoloft prescribed and not quite sure why it was on my note and we discussed it but it was not at the  pharmacy and patient did not notify us that there is a problem with her medications.  She continues to endorse same symptoms of excessive worry, nervousness, insomnia, panic attacks, she does get depressed and emotional during the holidays and it becomes tearful during her visit today regarding her father's anniversary of his death and other important dates with her family and members of her family that have passed away.   Checked PDMP, she has made 20 pills of Ativan last for nearly 2 months.  We discussed again, and while starting zoloft and during holidays with several anniversaries coming up she would like a refill to help get her through the holidays.   She has continued to keep her activities at her job less than what she was doing before she will have 2 weeks off from work to be able to enjoy the holidays without any large work tasks hanging over her.  She has to work until this Friday.   For her blood pressure patient is continue to take amlodipine 10 mg, losartan-hydrochlorothiazide 50-12.5.  She denies any medication side effects and is continue to work on a healthy diet and low-salt.  She denies chest pain, headaches, visual disturbances, palpitations  BP Readings from Last 3 Encounters:  02/27/19 (!) 138/100  01/16/19 118/82  01/02/19 (!) 168/112     Patient Active Problem List   Diagnosis Date Noted  . Anxiety disorder 01/16/2019  . Hypertensive urgency 01/16/2019  . Special screening for malignant neoplasms, colon   . Annual physical exam 05/21/2017  . Obesity (BMI 30-39.9) 05/21/2017    Past Surgical History:  Procedure Laterality Date  . COLONOSCOPY WITH PROPOFOL N/A 07/08/2017   Procedure: COLONOSCOPY WITH PROPOFOL;  Surgeon: Lin Landsman, MD;  Location:  [facility];  Service: Endoscopy;  Laterality: N/A;  . WRIST SURGERY      Family History  Problem Relation Age of Onset  . Brain cancer Paternal Uncle   . Lung cancer Paternal Uncle   . Leukemia Maternal  Grandmother   . Ovarian cancer Maternal Grandmother   . Breast cancer Paternal Grandfather   . Diabetes Mother   . Hypertension Mother   . Colon cancer Sister   . Thyroid disease Sister   . Asthma Son     Social History   Socioeconomic History  . Marital status: Single    Spouse name: Not on file  . Number of children: 2  . Years of education: 49  . Highest education level: Associate degree: academic program  Occupational History  . Not on file  Tobacco Use  . Smoking status: Never Smoker  . Smokeless tobacco: Never Used  Substance and Sexual Activity  . Alcohol use: No  . Drug use: No  . Sexual activity: Not Currently  Other Topics Concern  . Not on file  Social History Narrative  . Not on file   Social Determinants of Health   Financial Resource Strain: Low Risk   . Difficulty of Paying Living Expenses: Not hard at all  Food Insecurity: No Food Insecurity  . Worried About Charity fundraiser in the Last Year: Never true  . Ran Out of Food in the Last Year: Never true  Transportation Needs: No Transportation Needs  . Lack of Transportation (Medical): No  . Lack of Transportation (Non-Medical): No  Physical Activity: Inactive  . Days of Exercise per Week: 0 days  . Minutes of Exercise per Session: 0 min  Stress: No Stress Concern Present  . Feeling of Stress : Not at all  Social Connections: Unknown  . Frequency of Communication with Friends and Family: More than three times a week  . Frequency of Social Gatherings with Friends and Family: More than three times a week  . Attends Religious Services: More than 4 times per year  . Active Member of Clubs or Organizations: No  . Attends Archivist Meetings: Never  . Marital Status: Not on file  Intimate Partner Violence: Not At Risk  . Fear of Current or Ex-Partner: No  . Emotionally Abused: No  . Physically Abused: No  . Sexually Abused: No     Current Outpatient Medications:  .  amLODipine  (NORVASC) 10 MG tablet, Take 1 tablet (10 mg total) by mouth daily., Disp: 90 tablet, Rfl: 3 .  cholecalciferol (VITAMIN D3) 25 MCG (1000 UT) tablet, Take 1,000 Units by mouth daily., Disp: , Rfl:  .  levonorgestrel (MIRENA) 20 MCG/24HR IUD, 1 each by Intrauterine route once., Disp: , Rfl:  .  LORazepam (ATIVAN) 0.5 MG tablet, Take 0.5-1 tablets (0.25-0.5 mg total) by mouth 2 (two) times daily as needed for anxiety., Disp: 20 tablet, Rfl: 0 .  losartan-hydrochlorothiazide (HYZAAR) 50-12.5 MG tablet, Take 1 tablet by mouth daily., Disp: 90 tablet, Rfl: 3 .  methocarbamol (ROBAXIN) 500 MG tablet, Take 1 tablet (500 mg total) by mouth every 8 (eight) hours as needed for muscle spasms., Disp: 60 tablet, Rfl: 1 .  naproxen (NAPROSYN) 500 MG tablet, TAKE 1 TABLET BY MOUTH 2 TIMES DAILY AS NEEDED, Disp: , Rfl: 0  Allergies  Allergen Reactions  . Penicillins     I personally reviewed active problem list, medication list, allergies, family history, social history, health maintenance, notes from last encounter,  lab results, imaging with the patient/caregiver today.  Review of Systems  Constitutional: Negative.  Negative for irritability and malaise/fatigue.  HENT: Negative.   Eyes: Negative.  Negative for blurred vision.  Respiratory: Negative.  Negative for shortness of breath.   Cardiovascular: Negative.  Negative for chest pain, palpitations, orthopnea and PND.  Gastrointestinal: Negative.   Endocrine: Negative.   Genitourinary: Negative.  Negative for impotence.  Musculoskeletal: Negative.  Negative for neck pain.  Skin: Negative.   Allergic/Immunologic: Negative.   Neurological: Negative.  Negative for dizziness and headaches.  Hematological: Negative.   Psychiatric/Behavioral: Positive for agitation and sleep disturbance. Negative for confusion, decreased concentration and suicidal ideas. The patient is nervous/anxious, has insomnia and is hyperactive.   All other systems reviewed and are  negative.     Objective:    Virtual encounter, vitals limited, only able to obtain the following Today's Vitals   02/27/19 1444  BP: (!) 138/100  Weight: 199 lb (90.3 kg)  Height: 5\' 7"  (1.702 m)   Body mass index is 31.17 kg/m. Nursing Note and Vital Signs reviewed.  Physical Exam Vitals and nursing note reviewed.  Constitutional:      General: She is not in acute distress.    Appearance: Normal appearance. She is well-developed. She is not ill-appearing, toxic-appearing or diaphoretic.  HENT:     Head: Normocephalic and atraumatic.     Nose: Nose normal.  Eyes:     General:        Right eye: No discharge.        Left eye: No discharge.     Conjunctiva/sclera: Conjunctivae normal.  Neck:     Trachea: No tracheal deviation.  Pulmonary:     Effort: Pulmonary effort is normal. No respiratory distress.     Breath sounds: No stridor.  Skin:    General: Skin is dry.     Coloration: Skin is not jaundiced or pale.     Findings: No rash.  Neurological:     Mental Status: She is alert.     Motor: No abnormal muscle tone.     Coordination: Coordination normal.  Psychiatric:        Attention and Perception: Attention and perception normal.        Mood and Affect: Mood is anxious. Mood is not depressed. Affect is tearful.        Speech: Speech is rapid and pressured.        Behavior: Behavior is hyperactive. Behavior is not agitated, aggressive or withdrawn. Behavior is cooperative.        Thought Content: Thought content does not include homicidal or suicidal ideation. Thought content does not include homicidal or suicidal plan.     PE limited by telephone encounter  No results found for this or any previous visit (from the past 72 hour(s)).  PHQ2/9: Depression screen [facility] 2/9 02/27/2019 01/16/2019 01/02/2019  Decreased Interest 0 0 0  Down, Depressed, Hopeless 0 0 2  PHQ - 2 Score 0 0 2  Altered sleeping 0 0 3  Tired, decreased energy 0 0 3  Change in appetite 0 0  0  Feeling bad or failure about yourself  0 0 0  Trouble concentrating 0 0 0  Moving slowly or fidgety/restless 0 0 0  Suicidal thoughts 0 0 0  PHQ-9 Score 0 0 8  Difficult doing work/chores Not difficult at all Not difficult at all Not difficult at all   PHQ-2/9 Result is negative.    Fall Risk:  Fall Risk  02/27/2019 01/16/2019 01/02/2019  Falls in the past year? 0 0 0  Number falls in past yr: 0 0 0  Injury with Fall? 0 0 0     Assessment and Plan:     ICD-10-CM   1. Hypertension, unspecified type  I10    Still difficult to assess, systolic BP at goal, Diastolic elevated - likely overall controlled  diastolic elevated, but pt is visibly anxious and running around her work place - she will keep monitoring and send readings through mychart   2. Generalized anxiety disorder  F41.1    did not get or start zoloft - error with meds - unfortunately did not get to the pharmacy - rechecked pharmacy - start zoloft 25 mg at bedtime for 1-2 weeks, suspect she will require higher dose - encouraged her to increase to 50 mg 4 week f/up Ativan PRN for severe sx, discussed sedation, addiction - must use prn and infrequently while getting zoloft on board.  Pt likely needs therapy as well to help her tx anxiety   3. Abnormal grief reaction  F43.20    severe grief around holidays with death of father, anniversary, holidays, becomes tearful talking about it again, refill on ativan, again discussed use/SE   4. Situational stress  F43.9 LORazepam (ATIVAN) 0.5 MG tablet   see above - Work stress has improved, now other stress with COVID, family, holidays, death of family members   5. Educated about COVID-19 virus infection  Z71.89    anxious about COVID, discussed quarantine, masking, distancing and how to prevent spread of illness during holidays with family      I discussed the assessment and treatment plan with the patient. The patient was provided an opportunity to ask questions and all were  answered. The patient agreed with the plan and demonstrated an understanding of the instructions.  The patient was advised to call back or seek an in-person evaluation if the symptoms worsen or if the condition fails to improve as anticipated.  I provided 17 minutes of non-face-to-face time during this encounter.  Delsa Grana, PA-C 12/14/203:23 PM

## 2019-03-07 ENCOUNTER — Other Ambulatory Visit: Payer: Self-pay | Admitting: Family Medicine

## 2019-03-07 DIAGNOSIS — M62838 Other muscle spasm: Secondary | ICD-10-CM

## 2019-03-20 ENCOUNTER — Other Ambulatory Visit: Payer: Self-pay | Admitting: Family Medicine

## 2019-03-31 ENCOUNTER — Ambulatory Visit: Payer: BC Managed Care – PPO | Admitting: Family Medicine

## 2019-04-04 ENCOUNTER — Ambulatory Visit: Payer: BC Managed Care – PPO | Admitting: Family Medicine

## 2019-04-04 ENCOUNTER — Encounter: Payer: Self-pay | Admitting: Family Medicine

## 2019-04-04 ENCOUNTER — Other Ambulatory Visit: Payer: Self-pay

## 2019-04-04 VITALS — BP 122/80 | HR 86 | Temp 96.9°F | Resp 14 | Ht 67.0 in | Wt 199.0 lb

## 2019-04-04 DIAGNOSIS — F411 Generalized anxiety disorder: Secondary | ICD-10-CM

## 2019-04-04 DIAGNOSIS — F413 Other mixed anxiety disorders: Secondary | ICD-10-CM | POA: Diagnosis not present

## 2019-04-04 DIAGNOSIS — I10 Essential (primary) hypertension: Secondary | ICD-10-CM | POA: Diagnosis not present

## 2019-04-04 DIAGNOSIS — F439 Reaction to severe stress, unspecified: Secondary | ICD-10-CM

## 2019-04-04 MED ORDER — BUSPIRONE HCL 5 MG PO TABS: ORAL_TABLET | ORAL | 1 refills | Status: DC

## 2019-04-04 NOTE — Progress Notes (Signed)
Name: April Cross   MRN: [id number]    DOB: 03-23-1960   Date:04/04/2019       Progress Note  Chief Complaint  Patient presents with  . Follow-up  . Hypertension  . Anxiety     Subjective:   April Cross is a 59 y.o. female, presents to clinic for routine follow up on the conditions listed above.  HTN f/up: Currently managed on norvasc 10, losartan-HCTZ 50-12.5 Pt reports good med compliance and denies any SE.  No lightheadedness, hypotension, syncope. Blood pressure today is well controlled. Improved from past readings BP Readings from Last 3 Encounters:  04/04/19 122/80  02/27/19 (!) 138/100  01/16/19 118/82   Pt denies CP, SOB, exertional sx, LE edema, palpitation, Ha's, visual disturbances  Anxiety f/up after starting meds:  Started zoloft 25 mg and instructed to increase to 50 mg, she is currently taking 50 mg and she reports that she does feel it is helping quite a bit even though her anxiety depression screening scores are higher today than at her last visit about 2 months ago. GAD 7 : Generalized Anxiety Score 04/04/2019 01/16/2019  Nervous, Anxious, on Edge 1 1  Control/stop worrying 1 0  Worry too much - different things 1 1  Trouble relaxing 0 0  Restless 0 0  Easily annoyed or irritable 2 1  Afraid - awful might happen 1 0  Total GAD 7 Score 6 3  Anxiety Difficulty Somewhat difficult Not difficult at all   Depression screen [facility] 2/9 04/04/2019 02/27/2019 01/16/2019  Decreased Interest 1 0 0  Down, Depressed, Hopeless 0 0 0  PHQ - 2 Score 1 0 0  Altered sleeping 3 0 0  Tired, decreased energy 2 0 0  Change in appetite 0 0 0  Feeling bad or failure about yourself  0 0 0  Trouble concentrating 0 0 0  Moving slowly or fidgety/restless 0 0 0  Suicidal thoughts 0 0 0  PHQ-9 Score 6 0 0  Difficult doing work/chores Not difficult at all Not difficult at all Not difficult at all   Pt very anxious today and 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  She is states that teachers are terrified to get sick.  She feels that that is the main trigger for her worsening symptoms overall prior to the past couple weeks she was doing better after the holidays.  She is not talking to anybody about her concerns she has not established with a therapist or psychiatrist.  She does have access to this through her work but she is skeptical about her conversations would remain confidential and she is interested in talking to someone.   Patient Active Problem List   Diagnosis Date Noted  . Anxiety disorder 01/16/2019  . Hypertensive urgency 01/16/2019  . Special screening for malignant neoplasms, colon   . Annual physical exam 05/21/2017  . Obesity (BMI 30-39.9) 05/21/2017    Past Surgical History:  Procedure Laterality Date  . COLONOSCOPY WITH PROPOFOL N/A 07/08/2017   Procedure: COLONOSCOPY WITH PROPOFOL;  Surgeon: Lin Landsman, MD;  Location: [facility] ENDOSCOPY;  Service: Endoscopy;  Laterality: N/A;  . WRIST SURGERY      Family History  Problem Relation Age of Onset  . Brain cancer Paternal Uncle   . Lung cancer Paternal Uncle   . Leukemia Maternal Grandmother   . Ovarian cancer Maternal Grandmother   . Breast cancer Paternal Grandfather   . Diabetes Mother   . Hypertension Mother   . Colon cancer Sister   . Thyroid disease Sister   . Asthma Son     Social History   Socioeconomic History  . Marital status: Single    Spouse name: Not on file  . Number of children: 2  . Years of education: 23  . Highest education level: Associate degree: academic  program  Occupational History  . Not on file  Tobacco Use  . Smoking status: Never Smoker  . Smokeless tobacco: Never Used  Substance and Sexual Activity  . Alcohol use: No  . Drug use: No  . Sexual activity: Not Currently  Other Topics Concern  . Not on file  Social History Narrative  . Not on file   Social Determinants of Health   Financial Resource Strain: Low Risk   . Difficulty of Paying Living Expenses: Not hard at all  Food Insecurity: No Food Insecurity  . Worried About Charity fundraiser in the Last Year: Never true  . Ran Out of Food in the Last Year: Never true  Transportation Needs: No Transportation Needs  . Lack of Transportation (Medical): No  . Lack of Transportation (Non-Medical): No  Physical Activity: Inactive  . Days of Exercise per Week: 0 days  . Minutes of Exercise per Session: 0 min  Stress: No Stress Concern Present  . Feeling of Stress : Not at all  Social Connections: Unknown  . Frequency of Communication with Friends and Family: More than three times a week  . Frequency of Social Gatherings with Friends and Family: More than three times a week  . Attends Religious Services: More than 4 times per year  . Active Member of Clubs or Organizations: No  . Attends Archivist Meetings: Never  . Marital Status: Not on file  Intimate Partner Violence: Not At Risk  . Fear of Current or Ex-Partner: No  . Emotionally Abused: No  . Physically Abused: No  . Sexually Abused: No     Current Outpatient Medications:  .  amLODipine (NORVASC) 10 MG tablet, Take 1 tablet (10 mg total) by mouth daily., Disp: 90 tablet, Rfl: 3 .  cholecalciferol (VITAMIN D3) 25 MCG (1000 UT) tablet, Take 1,000 Units by mouth daily., Disp: , Rfl:  .  LORazepam (ATIVAN) 0.5 MG tablet, Take 0.5-1 tablets (0.25-0.5 mg total) by mouth 2 (two) times daily as needed for anxiety., Disp: 20 tablet, Rfl: 0 .  losartan-hydrochlorothiazide (HYZAAR) 50-12.5 MG tablet, Take 1 tablet  by mouth daily., Disp: 90 tablet, Rfl: 3 .  methocarbamol (ROBAXIN) 500 MG tablet, TAKE 1 TABLET (500 MG TOTAL) BY MOUTH EVERY 8 (EIGHT) HOURS AS NEEDED FOR MUSCLE SPASMS., Disp: 60 tablet, Rfl: 1 .  sertraline (ZOLOFT) 50 MG tablet, 25 mg poqpm x 2 weeks, then increase to 50 mg poqpm daily (Patient taking differently: Take 50 mg by mouth daily. 25 mg poqpm x 2 weeks, then increase to 50 mg poqpm daily), Disp: 60 tablet, Rfl: 3  Allergies  Allergen Reactions  . Penicillins     Chart Review Today: I personally reviewed active problem list,  medication list, allergies, family history, social history, health maintenance, notes from last encounter, lab results, imaging with the patient/caregiver today.   Review of Systems  Constitutional: Negative.   HENT: Negative.   Eyes: Negative.   Respiratory: Negative.   Cardiovascular: Negative.   Gastrointestinal: Negative.   Endocrine: Negative.   Genitourinary: Negative.   Musculoskeletal: Negative.   Skin: Negative.   Allergic/Immunologic: Negative.   Neurological: Negative.   Hematological: Negative.   Psychiatric/Behavioral: Negative.   All other systems reviewed and are negative.    Objective:    Vitals:   04/04/19 1308  BP: 122/80  Pulse: 86  Resp: 14  Temp: (!) 96.9 F (36.1 C)  SpO2: 99%  Weight: 199 lb (90.3 kg)  Height: _0  (1.702 m)    Body mass index is 31.17 kg/m.  Physical Exam Vitals and nursing note reviewed.  Constitutional:      General: She is not in acute distress.    Appearance: Normal appearance. She is well-developed. She is obese. She is not ill-appearing, toxic-appearing or diaphoretic.     Interventions: Face mask in place.  HENT:     Head: Normocephalic and atraumatic.     Right Ear: External ear normal.     Left Ear: External ear normal.  Eyes:     General: Lids are normal. No scleral icterus.       Right eye: No discharge.        Left eye: No discharge.     Conjunctiva/sclera:  Conjunctivae normal.  Neck:     Trachea: Phonation normal. No tracheal deviation.  Cardiovascular:     Rate and Rhythm: Normal rate and regular rhythm.     Pulses: Normal pulses.          Radial pulses are 2+ on the right side and 2+ on the left side.       Posterior tibial pulses are 2+ on the right side and 2+ on the left side.     Heart sounds: Normal heart sounds. No murmur. No friction rub. No gallop.   Pulmonary:     Effort: Pulmonary effort is normal. No respiratory distress.     Breath sounds: Normal breath sounds. No stridor. No wheezing, rhonchi or rales.  Chest:     Chest wall: No tenderness.  Abdominal:     General: Bowel sounds are normal. There is no distension.     Palpations: Abdomen is soft.     Tenderness: There is no abdominal tenderness. There is no guarding or rebound.  Musculoskeletal:        General: No deformity. Normal range of motion.     Cervical back: Normal range of motion and neck supple.     Right lower leg: No edema.     Left lower leg: No edema.  Lymphadenopathy:     Cervical: No cervical adenopathy.  Skin:    General: Skin is warm and dry.     Capillary Refill: Capillary refill takes less than 2 seconds.     Coloration: Skin is not jaundiced or pale.     Findings: No rash.  Neurological:     Mental Status: She is alert and oriented to person, place, and time.     Motor: No abnormal muscle tone.     Gait: Gait normal.  Psychiatric:        Attention and Perception: Attention normal.        Mood and Affect: Mood is anxious.  Speech: Speech is rapid and pressured.        Behavior: Behavior is agitated and hyperactive.     Comments: Patient throughout exam today constantly shaking her hands and arms and legs, talks very rapidly it is difficult to hold a conversation with her, she is very anxious and appears emotional and upset     PHQ2/9: Depression screen [facility] 2/9 04/04/2019 02/27/2019 01/16/2019 01/02/2019  Decreased Interest 1 0 0 0    Down, Depressed, Hopeless 0 0 0 2  PHQ - 2 Score 1 0 0 2  Altered sleeping 3 0 0 3  Tired, decreased energy 2 0 0 3  Change in appetite 0 0 0 0  Feeling bad or failure about yourself  0 0 0 0  Trouble concentrating 0 0 0 0  Moving slowly or fidgety/restless 0 0 0 0  Suicidal thoughts 0 0 0 0  PHQ-9 Score 6 0 0 8  Difficult doing work/chores Not difficult at all Not difficult at all Not difficult at all Not difficult at all    phq 9 is see HPI - reviewed   Fall Risk: Fall Risk  04/04/2019 02/27/2019 01/16/2019 01/02/2019  Falls in the past year? 0 0 0 0  Number falls in past yr: 0 0 0 0  Injury with Fall? 0 0 0 0    Functional Status Survey: Is the patient deaf or have difficulty hearing?: No Does the patient have difficulty seeing, even when wearing glasses/contacts?: Yes Does the patient have difficulty concentrating, remembering, or making decisions?: No Does the patient have difficulty walking or climbing stairs?: No Does the patient have difficulty dressing or bathing?: No Does the patient have difficulty doing errands alone such as visiting a doctor's office or shopping?: No   Assessment & Plan:     ICD-10-CM   1. Essential hypertension  I10    stable, well controlled  2. Generalized anxiety disorder  F41.1 Ambulatory referral to Psychiatry  3. Situational stress  F43.9 busPIRone (BUSPAR) 5 MG tablet    Ambulatory referral to Psychiatry   Multiple episodes since I have met her a few months ago of severe situational stress causing panic attacks and heightened nervousness anxiety fear and agitation  4. Other mixed anxiety disorders  F41.3 busPIRone (BUSPAR) 5 MG tablet    Ambulatory referral to Psychiatry   severe anxiety, panic and agitation, no visible improvement today on meds due to COVID at work, feels she needs specialist eval and therapy      Return in about 4 months (around 08/02/2019) for blood pressure check and labs, sooner as needed for anxiety meds .    Delsa Grana, PA-C 04/04/19 1:18 PM

## 2019-04-04 NOTE — Patient Instructions (Addendum)
COVID-19 Vaccine Information can be found at: [email or web address] For questions related to vaccine distribution or appointments, please email [email or web address] or call [phone].    Try buspar 5 mg twice a day for the next week and continue your zoloft at the same dose and see if it helps with your worsening anxiety symptoms, which are really normal while you are dealing with stress and changes at your job.  Buspar you can take 5-10 mg two to three times a day and if you feel like higher doses help then please let me know through a mychart message so I can adjust your meds for refills.

## 2019-04-20 ENCOUNTER — Other Ambulatory Visit: Payer: Self-pay | Admitting: Family Medicine

## 2019-04-28 ENCOUNTER — Other Ambulatory Visit: Payer: Self-pay | Admitting: Family Medicine

## 2019-04-28 DIAGNOSIS — D649 Anemia, unspecified: Secondary | ICD-10-CM

## 2019-04-28 DIAGNOSIS — F413 Other mixed anxiety disorders: Secondary | ICD-10-CM

## 2019-04-28 DIAGNOSIS — F439 Reaction to severe stress, unspecified: Secondary | ICD-10-CM

## 2019-04-28 DIAGNOSIS — E559 Vitamin D deficiency, unspecified: Secondary | ICD-10-CM

## 2019-04-28 DIAGNOSIS — I10 Essential (primary) hypertension: Secondary | ICD-10-CM

## 2019-04-28 DIAGNOSIS — Z5181 Encounter for therapeutic drug level monitoring: Secondary | ICD-10-CM

## 2019-04-28 NOTE — Telephone Encounter (Signed)
She is doing 10 mg prn.  But states does not need refill yet, that is pharmacy requesting

## 2019-04-28 NOTE — Telephone Encounter (Signed)
Pt.notified

## 2019-05-14 ENCOUNTER — Ambulatory Visit: Payer: BC Managed Care – PPO | Attending: Family Medicine

## 2019-05-14 DIAGNOSIS — Z23 Encounter for immunization: Secondary | ICD-10-CM

## 2019-05-14 NOTE — Progress Notes (Signed)
   Covid-19 Vaccination Clinic  Name:  April Cross    MRN: [id number] DOB: 11/24/1960  05/14/2019  [name] was observed post Covid-19 immunization for 15 minutes without incidence. She was provided with Vaccine Information Sheet and instruction to access the V-Safe system.   [name] was instructed to call 911 with any severe reactions post vaccine: Marland Kitchen Difficulty breathing  . Swelling of your face and throat  . A fast heartbeat  . A bad rash all over your body  . Dizziness and weakness    Immunizations Administered    Name Date Dose VIS Date Route   Pfizer COVID-19 Vaccine 05/14/2019 12:56 PM 0.3 mL 02/24/2019 Intramuscular   Manufacturer: [place]   Lot: [id number]   [place]: [id number]

## 2019-05-25 ENCOUNTER — Other Ambulatory Visit: Payer: Self-pay | Admitting: Family Medicine

## 2019-05-26 ENCOUNTER — Other Ambulatory Visit: Payer: Self-pay | Admitting: Family Medicine

## 2019-05-26 DIAGNOSIS — F439 Reaction to severe stress, unspecified: Secondary | ICD-10-CM

## 2019-05-26 DIAGNOSIS — F413 Other mixed anxiety disorders: Secondary | ICD-10-CM

## 2019-05-26 NOTE — Telephone Encounter (Signed)
Ins wanting 90 days

## 2019-06-06 ENCOUNTER — Ambulatory Visit: Payer: BC Managed Care – PPO | Attending: Internal Medicine

## 2019-06-06 DIAGNOSIS — Z23 Encounter for immunization: Secondary | ICD-10-CM

## 2019-06-06 NOTE — Progress Notes (Signed)
   Covid-19 Vaccination Clinic  Name:  April Cross    MRN: [id number] DOB: Oct 21, 1960  06/06/2019  April Cross was observed post Covid-19 immunization for 15 minutes without incident. She was provided with Vaccine Information Sheet and instruction to access the V-Safe system.   April Cross was instructed to call 911 with any severe reactions post vaccine: Marland Kitchen Difficulty breathing  . Swelling of face and throat  . A fast heartbeat  . A bad rash all over body  . Dizziness and weakness   Immunizations Administered    Name Date Dose VIS Date Route   Pfizer COVID-19 Vaccine 06/06/2019  2:39 PM 0.3 mL 02/24/2019 Intramuscular   Manufacturer: [place]   Lot: [id number]   [place]: [id number]

## 2019-08-02 ENCOUNTER — Ambulatory Visit: Payer: BC Managed Care – PPO | Admitting: Family Medicine

## 2019-08-15 ENCOUNTER — Ambulatory Visit: Payer: BC Managed Care – PPO | Admitting: Family Medicine

## 2019-08-31 ENCOUNTER — Other Ambulatory Visit: Payer: Self-pay

## 2019-08-31 ENCOUNTER — Encounter: Payer: Self-pay | Admitting: Family Medicine

## 2019-08-31 ENCOUNTER — Ambulatory Visit: Payer: BC Managed Care – PPO | Admitting: Family Medicine

## 2019-08-31 VITALS — BP 132/74 | HR 97 | Temp 97.3°F | Resp 16 | Ht 67.0 in | Wt 193.5 lb

## 2019-08-31 DIAGNOSIS — F411 Generalized anxiety disorder: Secondary | ICD-10-CM

## 2019-08-31 DIAGNOSIS — I10 Essential (primary) hypertension: Secondary | ICD-10-CM | POA: Diagnosis not present

## 2019-08-31 DIAGNOSIS — F413 Other mixed anxiety disorders: Secondary | ICD-10-CM | POA: Diagnosis not present

## 2019-08-31 DIAGNOSIS — M62838 Other muscle spasm: Secondary | ICD-10-CM

## 2019-08-31 DIAGNOSIS — F439 Reaction to severe stress, unspecified: Secondary | ICD-10-CM | POA: Diagnosis not present

## 2019-08-31 DIAGNOSIS — G4700 Insomnia, unspecified: Secondary | ICD-10-CM

## 2019-08-31 DIAGNOSIS — E559 Vitamin D deficiency, unspecified: Secondary | ICD-10-CM

## 2019-08-31 MED ORDER — BUSPIRONE HCL 10 MG PO TABS: 10.0000 mg | ORAL_TABLET | Freq: Two times a day (BID) | ORAL | 2 refills | Status: DC | PRN

## 2019-08-31 MED ORDER — BUSPIRONE HCL 5 MG PO TABS: ORAL_TABLET | ORAL | 3 refills | Status: DC

## 2019-08-31 MED ORDER — METHOCARBAMOL 500 MG PO TABS: 500.0000 mg | ORAL_TABLET | Freq: Three times a day (TID) | ORAL | 3 refills | Status: DC | PRN

## 2019-08-31 NOTE — Progress Notes (Signed)
Name: Chessica Audia   MRN: [id number]    DOB: [birth year]   Date:08/31/2019       Progress Note  Chief Complaint  Patient presents with  . Hypertension    follow up  . Depression  . Anxiety     Subjective:   Reneshia Zuccaro is a 59 y.o. female, presents to clinic for   Hypertension:  Currently managed on norvasc 10 mg and losartan HCTZ 50-12.5 Pt reports good med compliance and denies any SE.  No lightheadedness, hypotension, syncope. Blood pressure today is well controlled. BP Readings from Last 3 Encounters:  08/31/19 132/74  04/04/19 122/80  02/27/19 (!) 138/100  Pt denies CP, SOB, exertional sx, LE edema, palpitation, Ha's, visual disturbances   Anxiety f/up: She is on sertraline 50 mg, she does believe that this has been helping her anxiety overall she was given a short supply of Ativan last November and December around the holidays and anniversary death of her father, controlled substance database was reviewed again today, she is no longer using this medication, she has tried Hydrologist and found that a 10 mg dose worked better for her she would like a refill today.  She states that the little blue pill she takes at night does make her drowsy but she is having a very difficult time getting to sleep sometimes taking 2 to 3 hours before she can fall asleep and she does not feel well rested, she wonders if we can increase the dose but she is not sure exactly what medication it is GAD 7 : Generalized Anxiety Score 08/31/2019 04/04/2019 01/16/2019  Nervous, Anxious, on Edge 3 1 1   Control/stop worrying 0 1 0  Worry too much - different things 0 1 1  Trouble relaxing 0 0 0  Restless 0 0 0  Easily annoyed or irritable 0 2 1  Afraid - awful might happen 0 1 0  Total GAD 7 Score 3 6 3   Anxiety Difficulty - Somewhat difficult Not difficult at all   Overall her anxiety screening questions show some improvement.  She notes that stress and interpersonal conflict at work she  is able to deal with without as much worry stress anxiety or nerves.  She does have Father's Day coming up which she is worried about.  She has not talked with therapist.  She has only recently began to talk about physical health and mental health issues with her family members and her sons were very upset and worried about her because she is on blood pressure medication and anxiety medication, there worried to startle her.  Some of her children similarly are anxious or edgy.    Patient's mood overall is improved and she is not felt depressed or down her PHQ was negative and reviewed today Depression screen [facility] 2/9 08/31/2019 04/04/2019 02/27/2019  Decreased Interest 0 1 0  Down, Depressed, Hopeless 0 0 0  PHQ - 2 Score 0 1 0  Altered sleeping 0 3 0  Tired, decreased energy 0 2 0  Change in appetite 0 0 0  Feeling bad or failure about yourself  0 0 0  Trouble concentrating 0 0 0  Moving slowly or fidgety/restless 0 0 0  Suicidal thoughts 0 0 0  PHQ-9 Score 0 6 0  Difficult doing work/chores Not difficult at all Not difficult at all Not difficult at all   [facility] notes that she goes to bed and takes 2 to 3 hours before she can fall asleep.  She states that medication she takes at bedtime makes her drowsy but not enough to help her fall asleep so she will stare at the TV for several hours until she dozes off.  A few times she has tried taking Benadryl.  She has tried melatonin but this does not help her at all.  Low vitamin D, labs are from 2019 were very low, she did prescription supplement and has transition to daily over-the-counter   Current Outpatient Medications:  .  amLODipine (NORVASC) 10 MG tablet, Take 1 tablet (10 mg total) by mouth daily., Disp: 90 tablet, Rfl: 3 .  busPIRone (BUSPAR) 5 MG tablet, Take 5 mg po two to three times a day PRN for anxiety/aggitation symptoms, Disp: 90 tablet, Rfl: 1 .  cholecalciferol (VITAMIN D3) 25 MCG (1000 UT) tablet, Take 1,000 Units by mouth  daily., Disp: , Rfl:  .  LORazepam (ATIVAN) 0.5 MG tablet, Take 0.5-1 tablets (0.25-0.5 mg total) by mouth 2 (two) times daily as needed for anxiety., Disp: 20 tablet, Rfl: 0 .  losartan-hydrochlorothiazide (HYZAAR) 50-12.5 MG tablet, Take 1 tablet by mouth daily., Disp: 90 tablet, Rfl: 3 .  methocarbamol (ROBAXIN) 500 MG tablet, TAKE 1 TABLET (500 MG TOTAL) BY MOUTH EVERY 8 (EIGHT) HOURS AS NEEDED FOR MUSCLE SPASMS., Disp: 60 tablet, Rfl: 1 .  sertraline (ZOLOFT) 50 MG tablet, TAKE 1 TABLET BY MOUTH EVERY DAY, Disp: 90 tablet, Rfl: 2  Patient Active Problem List   Diagnosis Date Noted  . Anxiety disorder 01/16/2019  . Hypertensive urgency 01/16/2019  . Special screening for malignant neoplasms, colon   . Annual physical exam 05/21/2017  . Obesity (BMI 30-39.9) 05/21/2017    Past Surgical History:  Procedure Laterality Date  . COLONOSCOPY WITH PROPOFOL N/A 07/08/2017   Procedure: COLONOSCOPY WITH PROPOFOL;  Surgeon: Lin Landsman, MD;  Location: [facility] ENDOSCOPY;  Service: Endoscopy;  Laterality: N/A;  . WRIST SURGERY      Family History  Problem Relation Age of Onset  . Brain cancer Paternal Uncle   . Lung cancer Paternal Uncle   . Leukemia Maternal Grandmother   . Ovarian cancer Maternal Grandmother   . Breast cancer Paternal Grandfather   . Diabetes Mother   . Hypertension Mother   . Colon cancer Sister   . Thyroid disease Sister   . Asthma Son     Social History   Tobacco Use  . Smoking status: Never Smoker  . Smokeless tobacco: Never Used  Vaping Use  . Vaping Use: Never used  Substance Use Topics  . Alcohol use: No  . Drug use: No     Allergies  Allergen Reactions  . Penicillins     Health Maintenance  Topic Date Due  . Hepatitis C Screening  Never done  . HIV Screening  Never done  . MAMMOGRAM  Never done  . INFLUENZA VACCINE  10/15/2019  . PAP SMEAR-Modifier  05/21/2020  . COLONOSCOPY  07/09/2022  . TETANUS/TDAP  03/17/2027  . COVID-19 Vaccine   Completed    Chart Review Today: I personally reviewed active problem list, medication list, allergies, family history, social history, health maintenance, notes from last encounter, lab results, imaging with the patient/caregiver today.   Review of Systems  10 Systems reviewed and are negative for acute change except as noted in the HPI.  Objective:   Vitals:   08/31/19 0952  Pulse: 97  Resp: 16  Temp: (!) 97.3 F (36.3 C)  TempSrc: Temporal  SpO2: 99%  Weight:  193 lb 8 oz (87.8 kg)  Height: 5\' 7"  (1.702 m)    Body mass index is 30.31 kg/m.  Physical Exam Vitals and nursing note reviewed.  Constitutional:      General: She is not in acute distress.    Appearance: Normal appearance. She is well-developed. She is not ill-appearing, toxic-appearing or diaphoretic.     Interventions: Face mask in place.     Comments: Appears well, but a little sleepy, yawning several times during OV  HENT:     Head: Normocephalic and atraumatic.     Right Ear: External ear normal.     Left Ear: External ear normal.  Eyes:     General: Lids are normal. No scleral icterus.       Right eye: No discharge.        Left eye: No discharge.     Conjunctiva/sclera: Conjunctivae normal.  Neck:     Trachea: Phonation normal. No tracheal deviation.  Cardiovascular:     Rate and Rhythm: Normal rate and regular rhythm.     Pulses: Normal pulses.          Radial pulses are 2+ on the right side and 2+ on the left side.       Posterior tibial pulses are 2+ on the right side and 2+ on the left side.     Heart sounds: Normal heart sounds. No murmur heard.  No friction rub. No gallop.   Pulmonary:     Effort: Pulmonary effort is normal. No respiratory distress.     Breath sounds: Normal breath sounds. No stridor. No wheezing, rhonchi or rales.  Chest:     Chest wall: No tenderness.  Musculoskeletal:     Cervical back: Normal range of motion and neck supple.     Right lower leg: No edema.     Left  lower leg: No edema.  Lymphadenopathy:     Cervical: No cervical adenopathy.  Skin:    General: Skin is warm and dry.     Coloration: Skin is not jaundiced or pale.     Findings: No rash.  Neurological:     Mental Status: She is alert and oriented to person, place, and time.     Motor: No abnormal muscle tone.     Gait: Gait normal.  Psychiatric:        Mood and Affect: Mood normal.        Speech: Speech normal.        Behavior: Behavior normal.         Assessment & Plan:   1. Essential hypertension Stable, well-controlled today Continue losartan-HCTZ 50-12.5 mg and Norvasc 10 mg daily Last labs were reviewed today they were done about 8 months ago we will recheck her renal function and electrolytes She is tolerating medication without any side effects or concerns, she has not had any concerning symptoms like she was having initially when stress and anxiety were severe and blood pressure was elevated she had been having some chest pain at that time, has not had any symptoms similar to that for several months - BASIC METABOLIC PANEL WITH GFR  2. Generalized anxiety disorder Improving with sertraline 50 mg and as needed BuSpar -she does want a increase in medication dose but she is not sure which one she will be calling us back later today when she checks her bottle at home With prolonged grief reaction, situational stress and what seemed like a long history of generalized anxiety and some  continued symptoms this such as insomnia I did advise the patient once again today that be very beneficial for her to establish with a counselor or therapist.  Do believe this could help work on underlying issues, triggers, work through some of her grief, and help with insomnia if she is able to work with a therapist do some cognitive behavioral therapy and develop some additional healthy coping skills.  3. Situational stress Some continued stress at work and at home, changed BuSpar dose from 5 mg  to 10 mg and encouraged her to take 10 mg twice daily see if it helps with her stress and insomnia - busPIRone (BUSPAR) 10 MG tablet; Take 1 tablet (10 mg total) by mouth 2 (two) times daily as needed. Take 5 mg po two to three times a day PRN for anxiety/aggitation symptoms  Dispense: 180 tablet; Refill: 2  4. Other mixed anxiety disorders - busPIRone (BUSPAR) 10 MG tablet; Take 1 tablet (10 mg total) by mouth 2 (two) times daily as needed. Take 5 mg po two to three times a day PRN for anxiety/aggitation symptoms  Dispense: 180 tablet; Refill: 2  5. Vitamin D deficiency Very low in the past, has done prescription strength supplementation and transition to over-the-counter will recheck today, patient continues to endorse fatigue but she is sleeping poorly, have explained the vitamin D deficiency does not correlate well with physical symptoms but for her age do want to ensure that vitamin D is improving so as not to lead to osteoporosis etc. - VITAMIN D 25 Hydroxy (Vit-D Deficiency, Fractures)  6. Muscle spasm She continues to have intermittent muscle spasms that occur sometimes to her trunk or back arms or legs the Robaxin is still helpful, she uses as needed, have previously done labs did not seem to correlate with her symptoms, she has been encouraged to use electrolyte oral hydration packets, supplement magnesium, remain well-hydrated, discussed today that with her past medical history of mild anemia we may be able to recheck anemia and iron panel see if that could be contributory. - methocarbamol (ROBAXIN) 500 MG tablet; Take 1 tablet (500 mg total) by mouth every 8 (eight) hours as needed for muscle spasms.  Dispense: 60 tablet; Refill: 3  7. Insomnia, unspecified type Encouraged to use BuSpar at a higher dose as needed up to 60 mg daily, I believe she wants to increase in Zoloft medication which I feel would be appropriate for how severe and prolonged her anxiety symptoms have been.  Did  encourage sleep hygiene, she is given a handout, we discussed various treatment options for insomnia, I explained that therapy, lifestyle changes, sleep hygiene and working on her anxiety would be more effective for improved sleep then any other sedating medications would be.  We discussed the risk and benefit of additional medications.  For now we will try adjusting doses of medicine she already has, encouraged her to add some exercise, turn off screens several hours before bedtime, use the bedroom only for sleep and sex, again strongly encouraged to establish with a counselor or therapist and reach out to me if she needs a referral for this.  Return in about 6 months (around 03/01/2020) for Routine follow-up, Annual Physical.   Delsa Grana, PA-C 08/31/19 10:09 AM

## 2019-08-31 NOTE — Patient Instructions (Signed)
Try and look up some mindfulness apps I do think getting established with a therapist will help with your sleep symptoms and be very helpful with coping with stress and anxiety.     Sleep Hygiene Tips  1) Get regular. One of the best ways to train your body to sleep well is to go to bed and get up at more or less the same time every day, even on weekends and days off! This regular rhythm will make you feel better and will give your body something to work from. 2) Sleep when sleepy. Only try to sleep when you actually feel tired or sleepy, rather than spending too much time awake in bed. 3) Get up & try again. If you haven't been able to get to sleep after about 20 minutes or more, get up and do something calming or boring until you feel sleepy, then return to bed and try again. Sit quietly on the couch with the lights off (bright light will tell your brain that it is time to wake up), or read something boring like the phone book. Avoid doing anything that is too stimulating or interesting, as this will wake you up even more. 4) Avoid caffeine & nicotine. It is best to avoid consuming any caffeine (in coffee, tea, cola drinks, chocolate, and some medications) or nicotine (cigarettes) for at least 4-6 hours before going to bed. These substances act as stimulants and interfere with the ability to fall asleep 5) Avoid alcohol. It is also best to avoid alcohol for at least 4-6 hours before going to bed. Many people believe that alcohol is relaxing and helps them to get to sleep at first, but it actually interrupts the quality of sleep. 6) Bed is for sleeping. Try not to use your bed for anything other than sleeping and sex, so that your body comes to associate bed with sleep. If you use bed as a place to watch TV, eat, read, work on your laptop, pay bills, and other things, your body will not learn this Connection. 7) No naps. It is best to avoid taking naps during the day, to make  sure that you are tired at bedtime. If you can't make it through the day without a nap, make sure it is for less than an hour and before 3pm. 8) Sleep rituals. You can develop your own rituals of things to remind your body that it is time to sleep - some people find it useful to do relaxing stretches or breathing exercises for 15 minutes before bed each night, or sit calmly with a cup of caffeine-free tea. 9) Bathtime. Having a hot bath 1-2 hours before bedtime can be useful, as it will raise your body temperature, causing you to feel sleepy as your body temperature drops again. Research shows that sleepiness is associated with a drop in body temperature. 10) No clock-watching. Many people who struggle with sleep tend to watch the clock too much. Frequently checking the clock during the night can wake you up (especially if you turn on the light to read the time) and reinforces negative thoughts such as "Oh no, look how late it is, I'll never get to sleep" or "it's so early, I have only slept for 5 hours, this is terrible." 11) Use a sleep diary. This worksheet can be a useful way of making sure you have the right facts about your sleep, rather than making assumptions. Because a diary involves watching the clock (see point 10) it is a  good idea to only use it for two weeks to get an idea of what is going and then perhaps two months down the track to see how you are progressing. 12) Exercise. Regular exercise is a good idea to help with good sleep, but try not to do strenuous exercise in the 4 hours before bedtime. Morning walks are a great way to start the day feeling refreshed! 13) Eat right. A healthy, balanced diet will help you to sleep well, but timing is important. Some people find that a very empty stomach at bedtime is distracting, so it can be useful to have a light snack, but a heavy meal soon before bed can also interrupt sleep. Some people recommend a warm glass of  milk, which contains tryptophan, which acts as a natural sleep inducer. 14) The right space. It is very important that your bed and bedroom are quiet and comfortable for sleeping. A cooler room with enough blankets to stay warm is best, and make sure you have curtains or an eyemask to block out early morning light and earplugs if there is noise outside your room. 15) Keep daytime routine the same. Even if you have a bad night sleep and are tired it is important that you try to keep your daytime activities the same as you had planned. That is, don't avoid activities because you feel tired. This cann reinforce the insomnia.    Insomnia Insomnia is a sleep disorder that makes it difficult to fall asleep or stay asleep. Insomnia can cause fatigue, low energy, difficulty concentrating, mood swings, and poor performance at work or school. There are three different ways to classify insomnia:  Difficulty falling asleep.  Difficulty staying asleep.  Waking up too early in the morning. Any type of insomnia can be long-term (chronic) or short-term (acute). Both are common. Short-term insomnia usually lasts for three months or less. Chronic insomnia occurs at least three times a week for longer than three months. What are the causes? Insomnia may be caused by another condition, situation, or substance, such as:  Anxiety.  Certain medicines.  Gastroesophageal reflux disease (GERD) or other gastrointestinal conditions.  Asthma or other breathing conditions.  Restless legs syndrome, sleep apnea, or other sleep disorders.  Chronic pain.  Menopause.  Stroke.  Abuse of alcohol, tobacco, or illegal drugs.  Mental health conditions, such as depression.  Caffeine.  Neurological disorders, such as Alzheimer's disease.  An overactive thyroid (hyperthyroidism). Sometimes, the cause of insomnia may not be known. What increases the risk? Risk factors for insomnia include:  Gender.  Women are affected more often than men.  Age. Insomnia is more common as you get older.  Stress.  Lack of exercise.  Irregular work schedule or working night shifts.  Traveling between different time zones.  Certain medical and mental health conditions. What are the signs or symptoms? If you have insomnia, the main symptom is having trouble falling asleep or having trouble staying asleep. This may lead to other symptoms, such as:  Feeling fatigued or having low energy.  Feeling nervous about going to sleep.  Not feeling rested in the morning.  Having trouble concentrating.  Feeling irritable, anxious, or depressed. How is this diagnosed? This condition may be diagnosed based on:  Your symptoms and medical history. Your health care provider may ask about: ? Your sleep habits. ? Any medical conditions you have. ? Your mental health.  A physical exam. How is this treated? Treatment for insomnia depends on the cause. Treatment  may focus on treating an underlying condition that is causing insomnia. Treatment may also include:  Medicines to help you sleep.  Counseling or therapy.  Lifestyle adjustments to help you sleep better. Follow these instructions at home: Eating and drinking   Limit or avoid alcohol, caffeinated beverages, and cigarettes, especially close to bedtime. These can disrupt your sleep.  Do not eat a large meal or eat spicy foods right before bedtime. This can lead to digestive discomfort that can make it hard for you to sleep. Sleep habits   Keep a sleep diary to help you and your health care provider figure out what could be causing your insomnia. Write down: ? When you sleep. ? When you wake up during the night. ? How well you sleep. ? How rested you feel the next day. ? Any side effects of medicines you are taking. ? What you eat and drink.  Make your bedroom a dark, comfortable place where it is easy to fall asleep. ? Put up shades or  blackout curtains to block light from outside. ? Use a white noise machine to block noise. ? Keep the temperature cool.  Limit screen use before bedtime. This includes: ? Watching TV. ? Using your smartphone, tablet, or computer.  Stick to a routine that includes going to bed and waking up at the same times every day and night. This can help you fall asleep faster. Consider making a quiet activity, such as reading, part of your nighttime routine.  Try to avoid taking naps during the day so that you sleep better at night.  Get out of bed if you are still awake after 15 minutes of trying to sleep. Keep the lights down, but try reading or doing a quiet activity. When you feel sleepy, go back to bed. General instructions  Take over-the-counter and prescription medicines only as told by your health care provider.  Exercise regularly, as told by your health care provider. Avoid exercise starting several hours before bedtime.  Use relaxation techniques to manage stress. Ask your health care provider to suggest some techniques that may work well for you. These may include: ? Breathing exercises. ? Routines to release muscle tension. ? Visualizing peaceful scenes.  Make sure that you drive carefully. Avoid driving if you feel very sleepy.  Keep all follow-up visits as told by your health care provider. This is important. Contact a health care provider if:  You are tired throughout the day.  You have trouble in your daily routine due to sleepiness.  You continue to have sleep problems, or your sleep problems get worse. Get help right away if:  You have serious thoughts about hurting yourself or someone else. If you ever feel like you may hurt yourself or others, or have thoughts about taking your own life, get help right away. You can go to your nearest emergency department or call:  Your local emergency services (911 in the [place]  A suicide crisis helpline, such as the [facility] at (339)173-4901. This is open 24 hours a day. Summary  Insomnia is a sleep disorder that makes it difficult to fall asleep or stay asleep.  Insomnia can be long-term (chronic) or short-term (acute).  Treatment for insomnia depends on the cause. Treatment may focus on treating an underlying condition that is causing insomnia.  Keep a sleep diary to help you and your health care provider figure out what could be causing your insomnia. This information is not intended to replace  advice given to you by your health care provider. Make sure you discuss any questions you have with your health care provider. Document Revised: 02/12/2017 Document Reviewed: 12/10/2016 Elsevier Patient Education  2020 [employer].

## 2019-09-01 LAB — BASIC METABOLIC PANEL WITH GFR
BUN: 13 mg/dL (ref 7–25)
CO2: 30 mmol/L (ref 20–32)
Calcium: 9.9 mg/dL (ref 8.6–10.4)
Chloride: 105 mmol/L (ref 98–110)
Creat: 1 mg/dL (ref 0.50–1.05)
GFR, Est African American: 72 mL/min/{1.73_m2} (ref >=60)
GFR, Est Non African American: 62 mL/min/{1.73_m2} (ref >=60)
Glucose, Bld: 94 mg/dL (ref 65–99)
Potassium: 4.3 mmol/L (ref 3.5–5.3)
Sodium: 140 mmol/L (ref 135–146)

## 2019-09-01 LAB — VITAMIN D 25 HYDROXY (VIT D DEFICIENCY, FRACTURES): Vit D, 25-Hydroxy: 21 ng/mL — ABNORMAL LOW (ref 30–100)

## 2020-02-20 NOTE — Progress Notes (Signed)
Pt no showed for appt - note, labs, chart review was done in preparation for her appt - labs ordered - note will be left as is   Patient: April Cross, Female    DOB: Feb 13, 1961, 59 y.o.   MRN: [id number] Delsa Grana, PA-C Visit Date: 02/20/2020  Today's Provider: Delsa Grana, PA-C   No chief complaint on file.  Subjective:   Annual physical exam:  April Cross is a 59 y.o. female who presents today for complete physical exam:  Exercise/Activity:  Diet/nutrition:  Sleep:   Pt wished to discuss acute complaints  do routine f/up on chronic conditions today in addition to CPE. Advised pt of separate visit billing/coding  USPSTF grade A and B recommendations - reviewed and addressed today  Depression:  Phq 9 completed today by patient, was reviewed by me with patient in the room PHQ score is , pt feels  PHQ 2/9 Scores 08/31/2019 04/04/2019 02/27/2019 01/16/2019  PHQ - 2 Score 0 1 0 0  PHQ- 9 Score 0 6 0 0   Depression screen [facility] 2/9 08/31/2019 04/04/2019 02/27/2019 01/16/2019 01/02/2019  Decreased Interest 0 1 0 0 0  Down, Depressed, Hopeless 0 0 0 0 2  PHQ - 2 Score 0 1 0 0 2  Altered sleeping 0 3 0 0 3  Tired, decreased energy 0 2 0 0 3  Change in appetite 0 0 0 0 0  Feeling bad or failure about yourself  0 0 0 0 0  Trouble concentrating 0 0 0 0 0  Moving slowly or fidgety/restless 0 0 0 0 0  Suicidal thoughts 0 0 0 0 0  PHQ-9 Score 0 6 0 0 8  Difficult doing work/chores Not difficult at all Not difficult at all Not difficult at all Not difficult at all Not difficult at all    Alcohol screening:   Office Visit from 08/31/2019 in [facility]  AUDIT-C Score 0      Immunizations and Health Maintenance: Health Maintenance  Topic Date Due  . Hepatitis C Screening  Never done  . HIV Screening  Never done  . MAMMOGRAM  Never done  . INFLUENZA VACCINE  10/15/2019  . PAP SMEAR-Modifier  05/21/2020  . COLONOSCOPY  07/09/2022  .  TETANUS/TDAP  03/17/2027  . COVID-19 Vaccine  Completed     Hep C Screening:   STD testing and prevention (HIV/chl/gon/syphilis):  see above, no additional testing desired by pt today  Intimate partner violence:  Sexual History/Pain during Intercourse: Single  Menstrual History/LMP/Abnormal Bleeding:  No LMP recorded (lmp unknown). Patient is postmenopausal.  Incontinence Symptoms:   Breast cancer:  Last Mammogram: *see HM list above BRCA gene screening:   Cervical cancer screening:  Pt  family hx of cancers - breast, ovarian, uterine, colon:     Osteoporosis:   Discussion on osteoporosis per age, including high calcium and vitamin D supplementation, weight bearing exercises Pt is  supplementing with daily calcium/Vit D.  Bone scan/dexa Roughly experienced menopause at age   Skin cancer:  Hx of skin CA -  NO Discussed atypical lesions   Colorectal cancer:   Colonoscopy is    Discussed concerning signs and sx of CRC, pt denies   Lung cancer:   Low Dose CT Chest recommended if Age 51-80 years, 30 pack-year currently smoking OR have quit w/in 15years. Patient  qualify.    Social History   Tobacco Use  . Smoking status: Never Smoker  . Smokeless  tobacco: Never Used  Vaping Use  . Vaping Use: Never used  Substance Use Topics  . Alcohol use: No  . Drug use: No       Office Visit from 08/31/2019 in [facility]  AUDIT-C Score 0      Family History  Problem Relation Age of Onset  . Brain cancer Paternal Uncle   . Lung cancer Paternal Uncle   . Leukemia Maternal Grandmother   . Ovarian cancer Maternal Grandmother   . Breast cancer Paternal Grandfather   . Diabetes Mother   . Hypertension Mother   . Colon cancer Sister   . Thyroid disease Sister   . Asthma Son      Blood pressure/Hypertension: BP Readings from Last 3 Encounters:  08/31/19 132/74  04/04/19 122/80  02/27/19 (!) 138/100    Weight/Obesity: Wt Readings from Last 3  Encounters:  08/31/19 193 lb 8 oz (87.8 kg)  04/04/19 199 lb (90.3 kg)  02/27/19 199 lb (90.3 kg)   BMI Readings from Last 3 Encounters:  08/31/19 30.31 kg/m  04/04/19 31.17 kg/m  02/27/19 31.17 kg/m     Lipids:  Lab Results  Component Value Date   CHOL 131 11/29/2017   Lab Results  Component Value Date   HDL 59 11/29/2017   Lab Results  Component Value Date   LDLCALC 63 11/29/2017   Lab Results  Component Value Date   TRIG 46 11/29/2017   Lab Results  Component Value Date   CHOLHDL 2.2 11/29/2017   No results found for: LDLDIRECT Based on the results of lipid panel his/her cardiovascular risk factor ( using [facility] )  in the next 10 years is: The 10-year ASCVD risk score Mikey Bussing DC Brooke Bonito., et al., 2013) is: 4%   Values used to calculate the score:     Age: 61 years     Sex: Female     Is Non-Hispanic African American: Yes     Diabetic: No     Tobacco smoker: No     Systolic Blood Pressure: 765 mmHg     Is BP treated: Yes     HDL Cholesterol: 59 mg/dL     Total Cholesterol: 131 mg/dL Glucose:  Glucose, Bld  Date Value Ref Range Status  08/31/2019 94 65 - 99 mg/dL Final    Comment:    .            Fasting reference interval .   01/16/2019 98 65 - 99 mg/dL Final    Comment:    .            Fasting reference interval .   01/02/2019 104 (H) 70 - 99 mg/dL Final   Hypertension: BP Readings from Last 3 Encounters:  08/31/19 132/74  04/04/19 122/80  02/27/19 (!) 138/100   Obesity: Wt Readings from Last 3 Encounters:  08/31/19 193 lb 8 oz (87.8 kg)  04/04/19 199 lb (90.3 kg)  02/27/19 199 lb (90.3 kg)   BMI Readings from Last 3 Encounters:  08/31/19 30.31 kg/m  04/04/19 31.17 kg/m  02/27/19 31.17 kg/m      Advanced Care Planning:  A voluntary discussion about advance care planning including the explanation and discussion of advance directives.   Discussed health care proxy and Living will, and the patient was able to identify a health  care proxy as .   Patient  have a living will at present time.   Social History      She  Social History   Socioeconomic History  . Marital status: Single    Spouse name: Not on file  . Number of children: 2  . Years of education: 76  . Highest education level: Associate degree: academic program  Occupational History  . Not on file  Tobacco Use  . Smoking status: Never Smoker  . Smokeless tobacco: Never Used  Vaping Use  . Vaping Use: Never used  Substance and Sexual Activity  . Alcohol use: No  . Drug use: No  . Sexual activity: Not Currently  Other Topics Concern  . Not on file  Social History Narrative  . Not on file   Social Determinants of Health   Financial Resource Strain:   . Difficulty of Paying Living Expenses: Not on file  Food Insecurity:   . Worried About Charity fundraiser in the Last Year: Not on file  . Ran Out of Food in the Last Year: Not on file  Transportation Needs:   . Lack of Transportation (Medical): Not on file  . Lack of Transportation (Non-Medical): Not on file  Physical Activity:   . Days of Exercise per Week: Not on file  . Minutes of Exercise per Session: Not on file  Stress:   . Feeling of Stress : Not on file  Social Connections:   . Frequency of Communication with Friends and Family: Not on file  . Frequency of Social Gatherings with Friends and Family: Not on file  . Attends Religious Services: Not on file  . Active Member of Clubs or Organizations: Not on file  . Attends Archivist Meetings: Not on file  . Marital Status: Not on file    Family History        Family History  Problem Relation Age of Onset  . Brain cancer Paternal Uncle   . Lung cancer Paternal Uncle   . Leukemia Maternal Grandmother   . Ovarian cancer Maternal Grandmother   . Breast cancer Paternal Grandfather   . Diabetes Mother   . Hypertension Mother   . Colon cancer Sister   . Thyroid disease Sister   . Asthma Son     Patient  Active Problem List   Diagnosis Date Noted  . Anxiety disorder 01/16/2019  . Hypertensive urgency 01/16/2019  . Special screening for malignant neoplasms, colon   . Annual physical exam 05/21/2017  . Obesity (BMI 30-39.9) 05/21/2017    Past Surgical History:  Procedure Laterality Date  . COLONOSCOPY WITH PROPOFOL N/A 07/08/2017   Procedure: COLONOSCOPY WITH PROPOFOL;  Surgeon: Lin Landsman, MD;  Location: [facility] ENDOSCOPY;  Service: Endoscopy;  Laterality: N/A;  . WRIST SURGERY       Current Outpatient Medications:  .  amLODipine (NORVASC) 10 MG tablet, Take 1 tablet (10 mg total) by mouth daily., Disp: 90 tablet, Rfl: 3 .  busPIRone (BUSPAR) 10 MG tablet, Take 1 tablet (10 mg total) by mouth 2 (two) times daily as needed. Take 5 mg po two to three times a day PRN for anxiety/aggitation symptoms, Disp: 180 tablet, Rfl: 2 .  cholecalciferol (VITAMIN D3) 25 MCG (1000 UT) tablet, Take 1,000 Units by mouth daily., Disp: , Rfl:  .  LORazepam (ATIVAN) 0.5 MG tablet, Take 0.5-1 tablets (0.25-0.5 mg total) by mouth 2 (two) times daily as needed for anxiety., Disp: 20 tablet, Rfl: 0 .  losartan-hydrochlorothiazide (HYZAAR) 50-12.5 MG tablet, Take 1 tablet by mouth daily., Disp: 90 tablet, Rfl: 3 .  methocarbamol (ROBAXIN) 500 MG tablet,  Take 1 tablet (500 mg total) by mouth every 8 (eight) hours as needed for muscle spasms., Disp: 60 tablet, Rfl: 3 .  sertraline (ZOLOFT) 50 MG tablet, TAKE 1 TABLET BY MOUTH EVERY DAY, Disp: 90 tablet, Rfl: 2  Allergies  Allergen Reactions  . Penicillins     Patient Care Team: Delsa Grana, PA-C as PCP - General (Family Medicine)  Review of Systems          Objective:   Vitals:  There were no vitals filed for this visit.  There is no height or weight on file to calculate BMI.  Physical Exam    Fall Risk: Fall Risk  08/31/2019 04/04/2019 02/27/2019 01/16/2019 01/02/2019  Falls in the past year? 0 0 0 0 0  Number falls in past yr: 0 0 0 0 0   Injury with Fall? 0 0 0 0 0  Follow up Falls evaluation completed - - - -    Functional Status Survey:     Assessment & Plan:    CPE completed today  . USPSTF grade A and B recommendations reviewed with patient; age-appropriate recommendations, preventive care, screening tests, etc discussed and encouraged; healthy living encouraged; see AVS for patient education given to patient  . Discussed importance of 150 minutes of physical activity weekly, AHA exercise recommendations given to pt in AVS/handout  . Discussed importance of healthy diet:  eating lean meats and proteins, avoiding trans fats and saturated fats, avoid simple sugars and excessive carbs in diet, eat 6 servings of fruit/vegetables daily and drink plenty of water and avoid sweet beverages.    . Recommended pt to do annual eye exam and routine dental exams/cleanings  . Depression, alcohol, fall screening completed as documented above and per flowsheets  . Reviewed Health Maintenance: Health Maintenance  Topic Date Due  . Hepatitis C Screening  Never done  . HIV Screening  Never done  . MAMMOGRAM  Never done  . INFLUENZA VACCINE  10/15/2019  . PAP SMEAR-Modifier  05/21/2020  . COLONOSCOPY  07/09/2022  . TETANUS/TDAP  03/17/2027  . COVID-19 Vaccine  Completed    . Immunizations: Immunization History  Administered Date(s) Administered  . Influenza,inj,Quad PF,6+ Mos 01/02/2019  . PFIZER SARS-COV-2 Vaccination 05/14/2019, 06/06/2019  . Tdap 03/16/2017       ICD-10-CM   1. No-show for appointment  Z53.29    no show - labs and orders signed and will save for if pt reschedules  2. Adult general medical exam  Z00.00 Lipid panel    COMPLETE METABOLIC PANEL WITH GFR    CBC with Differential/Platelet    Hepatitis C antibody    HIV Antibody (routine testing w rflx)  3. Screening for HIV without presence of risk factors  Z11.4 HIV Antibody (routine testing w rflx)  4. Encounter for hepatitis C screening test for  low risk patient  Z11.59 Hepatitis C antibody  5. Encounter for screening mammogram for malignant neoplasm of breast  Z12.31 MM 3D SCREEN BREAST BILATERAL  6. Need for influenza vaccination  Z23   7. Anxiety disorder, unspecified type  F41.9   8. Essential hypertension  Q33 COMPLETE METABOLIC PANEL WITH GFR       Delsa Grana, PA-C 02/20/20 6:44 PM  [facility]

## 2020-02-20 NOTE — Patient Instructions (Signed)

## 2020-02-21 ENCOUNTER — Encounter: Payer: Self-pay | Admitting: Family Medicine

## 2020-02-21 ENCOUNTER — Ambulatory Visit (INDEPENDENT_AMBULATORY_CARE_PROVIDER_SITE_OTHER): Payer: BC Managed Care – PPO | Admitting: Family Medicine

## 2020-02-21 DIAGNOSIS — Z114 Encounter for screening for human immunodeficiency virus [HIV]: Secondary | ICD-10-CM

## 2020-02-21 DIAGNOSIS — Z0000 Encounter for general adult medical examination without abnormal findings: Secondary | ICD-10-CM

## 2020-02-21 DIAGNOSIS — Z1159 Encounter for screening for other viral diseases: Secondary | ICD-10-CM

## 2020-02-21 DIAGNOSIS — Z5329 Procedure and treatment not carried out because of patient's decision for other reasons: Secondary | ICD-10-CM

## 2020-02-21 DIAGNOSIS — Z23 Encounter for immunization: Secondary | ICD-10-CM

## 2020-02-21 DIAGNOSIS — Z1231 Encounter for screening mammogram for malignant neoplasm of breast: Secondary | ICD-10-CM

## 2020-02-21 DIAGNOSIS — F419 Anxiety disorder, unspecified: Secondary | ICD-10-CM

## 2020-02-21 DIAGNOSIS — I10 Essential (primary) hypertension: Secondary | ICD-10-CM

## 2021-10-28 ENCOUNTER — Encounter: Payer: Self-pay | Admitting: Family Medicine

## 2021-10-28 ENCOUNTER — Ambulatory Visit: Payer: Self-pay | Admitting: Family Medicine

## 2021-10-28 ENCOUNTER — Other Ambulatory Visit: Payer: Self-pay | Admitting: Family Medicine

## 2021-10-28 VITALS — BP 154/96 | HR 83 | Temp 97.9°F | Resp 16 | Ht 67.0 in | Wt 187.4 lb

## 2021-10-28 DIAGNOSIS — Z1231 Encounter for screening mammogram for malignant neoplasm of breast: Secondary | ICD-10-CM

## 2021-10-28 DIAGNOSIS — Z1159 Encounter for screening for other viral diseases: Secondary | ICD-10-CM

## 2021-10-28 DIAGNOSIS — Z5989 Other problems related to housing and economic circumstances: Secondary | ICD-10-CM

## 2021-10-28 DIAGNOSIS — F439 Reaction to severe stress, unspecified: Secondary | ICD-10-CM

## 2021-10-28 DIAGNOSIS — Z5181 Encounter for therapeutic drug level monitoring: Secondary | ICD-10-CM

## 2021-10-28 DIAGNOSIS — Z114 Encounter for screening for human immunodeficiency virus [HIV]: Secondary | ICD-10-CM

## 2021-10-28 DIAGNOSIS — F413 Other mixed anxiety disorders: Secondary | ICD-10-CM

## 2021-10-28 DIAGNOSIS — I10 Essential (primary) hypertension: Secondary | ICD-10-CM

## 2021-10-28 DIAGNOSIS — M62838 Other muscle spasm: Secondary | ICD-10-CM

## 2021-10-28 MED ORDER — LOSARTAN POTASSIUM-HCTZ 50-12.5 MG PO TABS: 1.0000 | ORAL_TABLET | Freq: Every day | ORAL | 3 refills | Status: DC

## 2021-10-28 MED ORDER — BUSPIRONE HCL 10 MG PO TABS: 10.0000 mg | ORAL_TABLET | Freq: Two times a day (BID) | ORAL | 2 refills | Status: DC | PRN

## 2021-10-28 MED ORDER — METHOCARBAMOL 500 MG PO TABS: 500.0000 mg | ORAL_TABLET | Freq: Three times a day (TID) | ORAL | 3 refills | Status: DC | PRN

## 2021-10-28 MED ORDER — SERTRALINE HCL 50 MG PO TABS: 50.0000 mg | ORAL_TABLET | Freq: Every day | ORAL | 3 refills | Status: DC

## 2021-10-28 MED ORDER — AMLODIPINE BESYLATE 10 MG PO TABS: 10.0000 mg | ORAL_TABLET | Freq: Every day | ORAL | 3 refills | Status: DC

## 2021-10-28 NOTE — Assessment & Plan Note (Signed)
refill of robaxin, gets cramps/spasms occasionally with HCTZ

## 2021-10-28 NOTE — Assessment & Plan Note (Signed)
Pt lost to f/up for the past 2 years, she had to travel out of state for work and has run out of meds, now is back in the area BP elevated today, previously it was controlled with norvasc, losartan and HCTZ Refill on prior meds and monitor Asked to come back for nurse bp recheck 2+ weeks after resuming meds Will need labs - cash pay [facility] BP Readings from Last 3 Encounters:  10/28/21 (!) 154/96  08/31/19 132/74  04/04/19 122/80  will recheck renal function and electrolytes - labs ordered for [facility]

## 2021-10-28 NOTE — Progress Notes (Signed)
Name: April Cross   MRN: [id number]    DOB: 07/15/1960   Date:10/28/2021       Progress Note  Chief Complaint  Patient presents with   Follow-up   Hypertension    Pt has been out of BP medication for a while.     Subjective:   April Cross is a 61 y.o. female, presents to clinic for follow up  Has been out of town for more than the last year   Hypertension:  Off meds bp elevated was previously on losartan hydrochlorothiazide and Norvasc BP Readings from Last 3 Encounters:  10/28/21 (!) 154/96  08/31/19 132/74  04/04/19 122/80   Pt denies CP, SOB, exertional sx, LE edema, palpitation, Ha's, visual disturbances, lightheadedness, hypotension, syncope.    On zoloft - she has not run out completely, feels good currently - would like same dose     10/28/2021    9:04 AM 08/31/2019    9:54 AM 04/04/2019    1:15 PM  Depression screen PHQ 2/9  Decreased Interest 0 0 1  Down, Depressed, Hopeless 0 0 0  PHQ - 2 Score 0 0 1  Altered sleeping 0 0 3  Tired, decreased energy 0 0 2  Change in appetite 0 0 0  Feeling bad or failure about yourself  0 0 0  Trouble concentrating 0 0 0  Moving slowly or fidgety/restless 0 0 0  Suicidal thoughts 0 0 0  PHQ-9 Score 0 0 6  Difficult doing work/chores Not difficult at all Not difficult at all Not difficult at all      10/28/2021    9:09 AM 08/31/2019    9:55 AM 04/04/2019    1:15 PM 01/16/2019   11:57 AM  GAD 7 : Generalized Anxiety Score  Nervous, Anxious, on Edge '1 3 1 1  '$ Control/stop worrying 0 0 1 0  Worry too much - different things 0 0 1 1  Trouble relaxing 0 0 0 0  Restless 0 0 0 0  Easily annoyed or irritable 0 0 2 1  Afraid - awful might happen 0 0 1 0  Total GAD 7 Score '1 3 6 3  '$ Anxiety Difficulty Not difficult at all  Somewhat difficult Not difficult at all          Current Outpatient Medications:    cholecalciferol (VITAMIN D3) 25 MCG (1000 UT) tablet, Take 1,000 Units by mouth daily., Disp:  , Rfl:    amLODipine (NORVASC) 10 MG tablet, Take 1 tablet (10 mg total) by mouth daily. (Patient not taking: Reported on 10/28/2021), Disp: 90 tablet, Rfl: 3   busPIRone (BUSPAR) 10 MG tablet, Take 1 tablet (10 mg total) by mouth 2 (two) times daily as needed. Take 5 mg po two to three times a day PRN for anxiety/aggitation symptoms (Patient not taking: Reported on 10/28/2021), Disp: 180 tablet, Rfl: 2   LORazepam (ATIVAN) 0.5 MG tablet, Take 0.5-1 tablets (0.25-0.5 mg total) by mouth 2 (two) times daily as needed for anxiety. (Patient not taking: Reported on 10/28/2021), Disp: 20 tablet, Rfl: 0   losartan-hydrochlorothiazide (HYZAAR) 50-12.5 MG tablet, Take 1 tablet by mouth daily. (Patient not taking: Reported on 10/28/2021), Disp: 90 tablet, Rfl: 3   methocarbamol (ROBAXIN) 500 MG tablet, Take 1 tablet (500 mg total) by mouth every 8 (eight) hours as needed for muscle spasms. (Patient not taking: Reported on 10/28/2021), Disp: 60 tablet, Rfl: 3   sertraline (ZOLOFT) 50 MG tablet, TAKE 1 TABLET  BY MOUTH EVERY DAY (Patient not taking: Reported on 10/28/2021), Disp: 90 tablet, Rfl: 2  Patient Active Problem List   Diagnosis Date Noted   Anxiety disorder 01/16/2019   Hypertensive urgency 01/16/2019   Special screening for malignant neoplasms, colon    Annual physical exam 05/21/2017   Obesity (BMI 30-39.9) 05/21/2017    Past Surgical History:  Procedure Laterality Date   COLONOSCOPY WITH PROPOFOL N/A 07/08/2017   Procedure: COLONOSCOPY WITH PROPOFOL;  Surgeon: Lin Landsman, MD;  Location: [facility] ENDOSCOPY;  Service: Endoscopy;  Laterality: N/A;   WRIST SURGERY      Family History  Problem Relation Age of Onset   Brain cancer Paternal Uncle    Lung cancer Paternal Uncle    Leukemia Maternal Grandmother    Ovarian cancer Maternal Grandmother    Breast cancer Paternal Grandfather    Diabetes Mother    Hypertension Mother    Colon cancer Sister    Thyroid disease Sister    Asthma Son      Social History   Tobacco Use   Smoking status: Never   Smokeless tobacco: Never  Vaping Use   Vaping Use: Never used  Substance Use Topics   Alcohol use: No   Drug use: No     Allergies  Allergen Reactions   Penicillins     Health Maintenance  Topic Date Due   Hepatitis C Screening  Never done   MAMMOGRAM  Never done   Zoster Vaccines- Shingrix (1 of 2) Never done   COVID-19 Vaccine (3 - Pfizer series) 08/01/2019   PAP SMEAR-Modifier  05/21/2020   INFLUENZA VACCINE  10/14/2021   COLONOSCOPY (Pts 45-42yr Insurance coverage will need to be confirmed)  07/09/2022   TETANUS/TDAP  03/17/2027   HIV Screening  Completed   HPV VACCINES  Aged Out    Chart Review Today: I personally reviewed active problem list, medication list, allergies, family history, social history, health maintenance, notes from last encounter, lab results, imaging with the patient/caregiver today.   Review of Systems  Constitutional: Negative.   HENT: Negative.    Eyes: Negative.   Respiratory: Negative.    Cardiovascular: Negative.   Gastrointestinal: Negative.   Endocrine: Negative.   Genitourinary: Negative.   Musculoskeletal: Negative.   Skin: Negative.   Allergic/Immunologic: Negative.   Neurological: Negative.   Hematological: Negative.   Psychiatric/Behavioral: Negative.    All other systems reviewed and are negative.    Objective:   Vitals:   10/28/21 0905 10/28/21 0914  BP: (!) 152/96 (!) 154/96  Pulse: 83   Resp: 16   Temp: 97.9 F (36.6 C)   TempSrc: Oral   SpO2: 98%   Weight: 187 lb 6.4 oz (85 kg)   Height: '5\' 7"'$  (1.702 m)     Body mass index is 29.35 kg/m.  Physical Exam Vitals and nursing note reviewed.  Constitutional:      General: She is not in acute distress.    Appearance: Normal appearance. She is well-developed. She is not ill-appearing, toxic-appearing or diaphoretic.     Interventions: Face mask in place.  HENT:     Head: Normocephalic and  atraumatic.     Right Ear: External ear normal.     Left Ear: External ear normal.  Eyes:     General: Lids are normal. No scleral icterus.       Right eye: No discharge.        Left eye: No discharge.     Conjunctiva/sclera:  Conjunctivae normal.  Neck:     Trachea: Phonation normal. No tracheal deviation.  Cardiovascular:     Rate and Rhythm: Normal rate and regular rhythm.     Pulses: Normal pulses.          Radial pulses are 2+ on the right side and 2+ on the left side.       Posterior tibial pulses are 2+ on the right side and 2+ on the left side.     Heart sounds: Normal heart sounds. No murmur heard.    No friction rub. No gallop.  Pulmonary:     Effort: Pulmonary effort is normal. No respiratory distress.     Breath sounds: Normal breath sounds. No stridor. No wheezing, rhonchi or rales.  Chest:     Chest wall: No tenderness.  Abdominal:     General: Bowel sounds are normal. There is no distension.     Palpations: Abdomen is soft.  Musculoskeletal:     Right lower leg: No edema.     Left lower leg: No edema.  Skin:    General: Skin is warm and dry.     Coloration: Skin is not jaundiced or pale.     Findings: No rash.  Neurological:     Mental Status: She is alert.     Motor: No abnormal muscle tone.     Gait: Gait normal.  Psychiatric:        Mood and Affect: Mood normal.        Speech: Speech normal.        Behavior: Behavior normal.         Assessment & Plan:   Problem List Items Addressed This Visit       Cardiovascular and Mediastinum   Essential hypertension - Primary    Pt lost to f/up for the past 2 years, she had to travel out of state for work and has run out of meds, now is back in the area BP elevated today, previously it was controlled with norvasc, losartan and HCTZ Refill on prior meds and monitor Asked to come back for nurse bp recheck 2+ weeks after resuming meds Will need labs - cash pay - [facility] BP Readings from  Last 3 Encounters:  10/28/21 (!) 154/96  08/31/19 132/74  04/04/19 122/80  will recheck renal function and electrolytes - labs ordered for [facility]       Relevant Medications   losartan-hydrochlorothiazide (HYZAAR) 50-12.5 MG tablet   amLODipine (NORVASC) 10 MG tablet   Other Relevant Orders   Lipid panel   Comprehensive metabolic panel     Other   Anxiety disorder    phq and gad 7 scores good, sx well controlled she would like to stay on zoloft 50 mg    10/28/2021    9:04 AM 08/31/2019    9:54 AM 04/04/2019    1:15 PM  Depression screen PHQ 2/9  Decreased Interest 0 0 1  Down, Depressed, Hopeless 0 0 0  PHQ - 2 Score 0 0 1  Altered sleeping 0 0 3  Tired, decreased energy 0 0 2  Change in appetite 0 0 0  Feeling bad or failure about yourself  0 0 0  Trouble concentrating 0 0 0  Moving slowly or fidgety/restless 0 0 0  Suicidal thoughts 0 0 0  PHQ-9 Score 0 0 6  Difficult doing work/chores Not difficult at all Not difficult at all Not difficult at all  10/28/2021    9:09 AM 08/31/2019    9:55 AM 04/04/2019    1:15 PM 01/16/2019   11:57 AM  GAD 7 : Generalized Anxiety Score  Nervous, Anxious, on Edge '1 3 1 1  '$ Control/stop worrying 0 0 1 0  Worry too much - different things 0 0 1 1  Trouble relaxing 0 0 0 0  Restless 0 0 0 0  Easily annoyed or irritable 0 0 2 1  Afraid - awful might happen 0 0 1 0  Total GAD 7 Score '1 3 6 3  '$ Anxiety Difficulty Not difficult at all  Somewhat difficult Not difficult at all   Situational stress has changed a lot since 2 years ago, 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 Can use buspar PRN  Refill on zoloft sent in        Relevant Medications   busPIRone (BUSPAR) 10 MG tablet   sertraline (ZOLOFT) 50 MG tablet   Muscle spasm    refill of robaxin, gets cramps/spasms occasionally with HCTZ      Relevant Medications    methocarbamol (ROBAXIN) 500 MG tablet   Other Visit Diagnoses     Encounter for screening mammogram for malignant neoplasm of breast       need to see if she can get free mammogram    Relevant Orders   MM 3D SCREEN BREAST BILATERAL   AMB Referral to [place]   Encounter for medication monitoring       Relevant Orders   Lipid panel   Comprehensive metabolic panel   AMB Referral to [place]   Encounter for hepatitis C screening test for low risk patient       Relevant Orders   Hepatitis C antibody   Screening for HIV without presence of risk factors       Relevant Orders   HIV Antibody (routine testing w rflx)   Does not have health insurance       Relevant Orders   AMB Referral to [place]       F/up nurse BP recheck in 3-4 weeks Labs with help from [facility] If bp improved and labs unremarkable then 6 month f/up   Delsa Grana, PA-C 10/28/21 9:33 AM

## 2021-10-28 NOTE — Assessment & Plan Note (Signed)
phq and gad 7 scores good, sx well controlled she would like to stay on zoloft 50 mg    10/28/2021    9:04 AM 08/31/2019    9:54 AM 04/04/2019    1:15 PM  Depression screen PHQ 2/9  Decreased Interest 0 0 1  Down, Depressed, Hopeless 0 0 0  PHQ - 2 Score 0 0 1  Altered sleeping 0 0 3  Tired, decreased energy 0 0 2  Change in appetite 0 0 0  Feeling bad or failure about yourself  0 0 0  Trouble concentrating 0 0 0  Moving slowly or fidgety/restless 0 0 0  Suicidal thoughts 0 0 0  PHQ-9 Score 0 0 6  Difficult doing work/chores Not difficult at all Not difficult at all Not difficult at all      10/28/2021    9:09 AM 08/31/2019    9:55 AM 04/04/2019    1:15 PM 01/16/2019   11:57 AM  GAD 7 : Generalized Anxiety Score  Nervous, Anxious, on Edge '1 3 1 1  '$ Control/stop worrying 0 0 1 0  Worry too much - different things 0 0 1 1  Trouble relaxing 0 0 0 0  Restless 0 0 0 0  Easily annoyed or irritable 0 0 2 1  Afraid - awful might happen 0 0 1 0  Total GAD 7 Score '1 3 6 3  '$ Anxiety Difficulty Not difficult at all  Somewhat difficult Not difficult at all   Situational stress has changed a lot since 2 years ago, 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 Can use buspar PRN  Refill on zoloft sent in

## 2021-10-30 ENCOUNTER — Telehealth: Payer: Self-pay | Admitting: *Deleted

## 2021-10-30 NOTE — Chronic Care Management (AMB) (Signed)
  Care Coordination   Note   10/30/2021 Name: April Cross MRN: [id number] DOB: 12/21/1960  April Cross is a 61 y.o. year old female who sees Delsa Grana, [place] for primary care. I reached out to [name] by phone today to offer care coordination services.  [name] was given information about Care Coordination services today including:   The Care Coordination services include support from the care team which includes your Nurse Coordinator, Clinical Social Worker, or Pharmacist.  The Care Coordination team is here to help remove barriers to the health concerns and goals most important to you. Care Coordination services are voluntary, and the patient may decline or stop services at any time by request to their care team member.   Care Coordination Consent Status: Patient agreed to services and verbal consent obtained.   Follow up plan:  Telephone appointment with care coordination team member scheduled for:  10/31/2021  Encounter Outcome:  Pt. Scheduled from referral   Julian Hy, [place] Direct Dial: [phone]

## 2021-10-31 ENCOUNTER — Ambulatory Visit: Payer: Self-pay | Admitting: *Deleted

## 2021-10-31 NOTE — Patient Outreach (Signed)
  Care Coordination   Initial Visit Note   10/31/2021 Name: April Cross MRN: [id number] DOB: Mar 06, 1961  April Cross is a 61 y.o. year old female who sees Delsa Grana, [place] for primary care. I spoke with  April Cross by phone today  What matters to the patients health and wellness today?  Mammogram and Lab needs    Goals Addressed               This Visit's Progress     I need a mammogram and labs done (pt-stated)        Care Coordination Interventions:  Patient currently uninsured and is in need of a mammogram and labs drawn SDOH screening completed, patient discussed the loss of her mother but is coping well Confirmed that patient has been prescribed medication to manage depression and anxiety-history of grief counseling acknowldeged Active listening / Reflection utilized  Emotional Support Provided Discussed plan to contact the  [facility]/Christy Kalman Shan [phone] to discuss any possible programs that patient could utilize  This Education officer, museum to follow up with patients provider regarding labs that need to be drawn-patient may need to get the labs drawn and then apply for charity care once the bill is received-will clarify this        SDOH assessments and interventions completed:  Yes  SDOH Interventions Today    Flowsheet Row Most Recent Value  SDOH Interventions   Food Insecurity Interventions Intervention Not Indicated  Financial Strain Interventions Intervention Not Indicated  Housing Interventions Intervention Not Indicated  Physical Activity Interventions Intervention Not Indicated  Stress Interventions Intervention Not Indicated  Transportation Interventions Intervention Not Indicated        Care Coordination Interventions Activated:  Yes  Care Coordination Interventions:  Yes, provided   Follow up plan: Follow up call scheduled for 11/07/21    Encounter Outcome:  Pt. Visit Completed

## 2021-10-31 NOTE — Patient Instructions (Signed)
Visit Information  Thank you for taking time to visit with me today. Please don't hesitate to contact me if I can be of assistance to you.   Following are the goals we discussed today:   Goals Addressed               This Visit's Progress     I need a mammogram and labs done (pt-stated)        Care Coordination Interventions:  Patient currently uninsured and is in need of a mammogram and labs drawn SDOH screening completed, patient discussed the loss of her mother but is coping well Confirmed that patient has been prescribed medication to manage depression and anxiety-history of grief counseling acknowldeged Active listening / Reflection utilized  Emotional Support Provided Discussed plan to contact the  [facility]/Christy Kalman Shan [phone] to discuss any possible programs that patient could utilize  This Education officer, museum to follow up with patients provider regarding labs that need to be drawn-patient may need to get the labs drawn and then apply for charity care once the bill is received-will clarify this        Our next appointment is by telephone on 11/07/21 at 9am  Please call the care guide team at [phone] if you need to cancel or reschedule your appointment.   If you are experiencing a Mental Health or [place] or need someone to talk to, please call the Suicide and Crisis Lifeline: [phone]   Patient verbalizes understanding of instructions and care plan provided today and agrees to view in [place]. Active MyChart status and patient understanding of how to access instructions and care plan via MyChart confirmed with patient.     Telephone follow up appointment with care management team member scheduled for: 11/07/21  Elliot Gurney, [place] Worker  [facility] Care Management [phone]

## 2021-11-07 ENCOUNTER — Ambulatory Visit: Payer: Self-pay | Admitting: *Deleted

## 2021-11-07 NOTE — Patient Instructions (Signed)
Visit Information  Thank you for taking time to visit with me today. Please don't hesitate to contact me if I can be of assistance to you.   Following are the goals we discussed today:   Goals Addressed               This Visit's Progress     I need a mammogram and labs done (pt-stated)        Care Coordination Interventions:  Patient currently uninsured and is in need of a mammogram and labs drawn Followed up on referral to the [facility], patient confirmed that she has contacted [name] and has a scheduled breast exam for 11/24/21 Collaboration phone call to the [facility], left VM for a return call. They will draw labs, however patient would need to apply for eligibility. [facility] confirms that their lab contract is with [facility]. Patient will have to contact them to set up a possible payment plan to for labs to be drawn This social worker will discuss [facility] option with patient during next call        Our next appointment is by telephone on 11/14/21 at 9am  Please call the care guide team at [phone] if you need to cancel or reschedule your appointment.   If you are experiencing a Mental Health or [place] or need someone to talk to, please call the Suicide and Crisis Lifeline: 988   Patient verbalizes understanding of instructions and care plan provided today and agrees to view in [place]. Active MyChart status and patient understanding of how to access instructions and care plan via MyChart confirmed with patient.     Telephone follow up appointment with care management team member scheduled for: 11/14/21  Elliot Gurney, [place] Worker  [facility] Care Management [phone]

## 2021-11-07 NOTE — Patient Outreach (Signed)
  Care Coordination   Follow Up Visit Note   11/07/2021 Name: April Cross MRN: [id number] DOB: [birth year]  April Cross is a 61 y.o. year old female who sees Delsa Grana, [place] for primary care. I spoke with  [name] by phone today.  What matters to the patients health and wellness today?  Assistance with getting a Mammogram and labs    Goals Addressed               This Visit's Progress     I need a mammogram and labs done (pt-stated)        Care Coordination Interventions:  Patient currently uninsured and is in need of a mammogram and labs drawn Followed up on referral to the [facility], patient confirmed that she has contacted [name] and has a scheduled breast exam for 11/24/21 Collaboration phone call to the [facility], left VM for a return call. They will draw labs, however patient would need to apply for eligibility. [facility] confirms that their lab contract is with [facility]. Patient will have to contact them to set up a possible payment plan to for labs to be drawn This social worker will discuss [facility] option with patient during next call         SDOH assessments and interventions completed:  Yes     Care Coordination Interventions Activated:  Yes  Care Coordination Interventions:  Yes, provided   Follow up plan: Follow up call scheduled for 11/14/21    Encounter Outcome:  Pt. Visit Completed

## 2021-11-07 NOTE — Patient Outreach (Signed)
  Care Coordination   Follow Up Visit Note   11/07/2021 Name: Milly Goggins MRN: [id number] DOB: May 13, 1960  Katia Hannen is a 61 y.o. year old female who sees Delsa Grana, [place] for primary care. I spoke with  [name] by phone today.  What matters to the patients health and wellness today?  Breast exam and Labs Drawn    Goals Addressed               This Visit's Progress     I need a mammogram and labs done (pt-stated)        Care Coordination Interventions:  Patient currently uninsured and is in need of a mammogram and labs drawn Followed up on referral to the [facility], patient confirmed that she has contacted [name] and has a scheduled breast exam for 11/24/21 Collaboration phone call to the [facility], left VM for a return call. They will draw labs, however patient would need to apply for eligibility. [facility] confirms that their lab contract is with [employer]. Patient will have to contact them to set up a possible payment plan to for labs to be drawn         SDOH assessments and interventions completed:  Yes     Care Coordination Interventions Activated:  Yes  Care Coordination Interventions:  Yes, provided   Follow up plan: Follow up call scheduled for 11/14/21    Encounter Outcome:  Pt. Visit Completed

## 2021-11-14 ENCOUNTER — Ambulatory Visit: Payer: Self-pay | Admitting: *Deleted

## 2021-11-14 NOTE — Patient Outreach (Signed)
  Care Coordination   Follow Up Visit Note   11/14/2021 Name: April Cross MRN: [id number] DOB: June 29, 1960  April Cross is a 61 y.o. year old female who sees April Cross, [place] for primary care. I spoke with  April Cross by phone today.  What matters to the patients health and wellness today?  Obtaining a Mammogram and labs    Goals Addressed               This Visit's Progress     I need a mammogram and labs done (pt-stated)        Care Coordination Interventions:  Patient currently uninsured and is in need of a mammogram and labs drawn Followed up on referral to the [facility], patient confirmed that she has contacted April Cross and has a scheduled breast exam for 11/24/21 Patient informed of Collaboration phone call to the [facility]. Patient agrees to consider this as an option for low/no cost follow up. Application mailed to patient's home . This Education officer, museum also discussed obtaining requested labs through [facility]. Patient would  have to contact them to set up a possible payment plan to for labs to be drawn Patient agrees to contact this social worker with any additional community resource needs         SDOH assessments and interventions completed:  Yes     Care Coordination Interventions Activated:  Yes  Care Coordination Interventions:  Yes, provided   Follow up plan: No further intervention required.   Encounter Outcome:  Pt. Visit Completed

## 2021-11-14 NOTE — Patient Instructions (Signed)
Visit Information  Thank you for taking time to visit with me today. Please don't hesitate to contact me if I can be of assistance to you.   Following are the goals we discussed today:   Goals Addressed               This Visit's Progress     I need a mammogram and labs done (pt-stated)        Care Coordination Interventions:  Patient currently uninsured and is in need of a mammogram and labs drawn Followed up on referral to the [facility], patient confirmed that she has contacted [name] and has a scheduled breast exam for 11/24/21 Patient informed of Collaboration phone call to the [facility]. Patient agrees to consider this as an option for low/no cost follow up. Application mailed to patient's home . This Education officer, museum also discussed obtaining requested labs through [facility]. Patient would  have to contact them to set up a possible payment plan to for labs to be drawn Patient agrees to contact this social worker with any additional community resource needs          Please call the care guide team at [phone] if you need to cancel or reschedule your appointment.   If you are experiencing a Mental Health or [place] or need someone to talk to, please call the Suicide and Crisis Lifeline: 988   Patient verbalizes understanding of instructions and care plan provided today and agrees to view in [place]. Active MyChart status and patient understanding of how to access instructions and care plan via MyChart confirmed with patient.     No further follow up required:     Elliot Gurney, [place] Worker  [facility] Care Management [phone]

## 2021-11-18 ENCOUNTER — Ambulatory Visit: Payer: Self-pay

## 2021-11-19 ENCOUNTER — Ambulatory Visit: Payer: Self-pay

## 2022-01-05 ENCOUNTER — Other Ambulatory Visit: Payer: Self-pay

## 2022-01-05 DIAGNOSIS — Z1231 Encounter for screening mammogram for malignant neoplasm of breast: Secondary | ICD-10-CM

## 2022-01-06 ENCOUNTER — Ambulatory Visit: Payer: Self-pay | Attending: Hematology and Oncology | Admitting: *Deleted

## 2022-01-06 ENCOUNTER — Ambulatory Visit
Admission: RE | Admit: 2022-01-06 | Discharge: 2022-01-06 | Disposition: A | Discharge status: Home / Self Care | Payer: Self-pay | Source: Ambulatory Visit | Attending: Obstetrics and Gynecology | Admitting: Obstetrics and Gynecology

## 2022-01-06 VITALS — BP 133/103 | Wt 185.0 lb

## 2022-01-06 DIAGNOSIS — Z01419 Encounter for gynecological examination (general) (routine) without abnormal findings: Secondary | ICD-10-CM

## 2022-01-06 DIAGNOSIS — Z1231 Encounter for screening mammogram for malignant neoplasm of breast: Secondary | ICD-10-CM | POA: Insufficient documentation

## 2022-01-06 NOTE — Progress Notes (Signed)
Ms. Tykia Mellone is a 61 y.o. female who presents to [facility] clinic today with no complaints.  She presents for clinical breast exam and mammogram.    Pap Smear: Pap not smear completed today. Last Pap smear was 05/21/17 at [facility] and was  negative / negative . Per patient has no history of an abnormal Pap smear.  Last Pap smear result is available in Epic.   Physical exam: Breasts Breasts symmetrical. No skin abnormalities bilateral breasts. No nipple retraction bilateral breasts. No nipple discharge bilateral breasts. No lymphadenopathy. No lumps palpated bilateral breasts.       Pelvic/Bimanual Pap is not indicated today    Smoking History: Patient has never smoked.    Patient Navigation: Patient education provided.  Access to services provided for patient through [facility] program. No interpreter provided. No transportation provided   Colorectal Cancer Screening: Per patient has had colonoscopy completed on 07/08/17 and was negative with tubular adenoma.  No complaints today.    Breast and Cervical Cancer Risk Assessment: Patient does not have family history of breast cancer, known genetic mutations, or radiation treatment to the chest before age 34.  Patient does not have history of cervical dysplasia, immunocompromised, or DES exposure in-utero.  Risk Assessment     Risk Scores       01/06/2022   Last edited by: Demetrius Revel, LPN   5-year risk: 1.4 %   Lifetime risk: 7.1 %            A: BCCCP exam without pap smear   P: Referred patient to the [facility] for a screening mammogram.  Appointment scheduled today.  Will follow up per BCCCP protocol.  Rico Junker, RN 01/06/2022 10:15 AM

## 2022-10-01 ENCOUNTER — Ambulatory Visit: Payer: Self-pay | Admitting: Emergency Medicine

## 2023-09-30 ENCOUNTER — Ambulatory Visit: Payer: Self-pay | Admitting: Family Medicine

## 2023-09-30 ENCOUNTER — Encounter: Payer: Self-pay | Admitting: Family Medicine

## 2023-09-30 VITALS — BP 168/102 | HR 86 | Resp 16 | Ht 67.0 in | Wt 192.0 lb

## 2023-09-30 DIAGNOSIS — Z5181 Encounter for therapeutic drug level monitoring: Secondary | ICD-10-CM

## 2023-09-30 DIAGNOSIS — I10 Essential (primary) hypertension: Secondary | ICD-10-CM

## 2023-09-30 DIAGNOSIS — F439 Reaction to severe stress, unspecified: Secondary | ICD-10-CM

## 2023-09-30 DIAGNOSIS — Z1159 Encounter for screening for other viral diseases: Secondary | ICD-10-CM

## 2023-09-30 DIAGNOSIS — Z1211 Encounter for screening for malignant neoplasm of colon: Secondary | ICD-10-CM

## 2023-09-30 DIAGNOSIS — R1310 Dysphagia, unspecified: Secondary | ICD-10-CM

## 2023-09-30 DIAGNOSIS — F413 Other mixed anxiety disorders: Secondary | ICD-10-CM

## 2023-09-30 DIAGNOSIS — G44209 Tension-type headache, unspecified, not intractable: Secondary | ICD-10-CM

## 2023-09-30 MED ORDER — SERTRALINE HCL 50 MG PO TABS: 50.0000 mg | ORAL_TABLET | Freq: Every day | ORAL | 3 refills | Status: DC

## 2023-09-30 MED ORDER — BUSPIRONE HCL 10 MG PO TABS
5.0000 mg | ORAL_TABLET | Freq: Two times a day (BID) | ORAL | 2 refills | Status: AC | PRN
Start: 2023-09-30 — End: ?

## 2023-09-30 MED ORDER — AMLODIPINE BESYLATE 10 MG PO TABS
10.0000 mg | ORAL_TABLET | Freq: Every day | ORAL | 1 refills | Status: DC
Start: 2023-09-30 — End: 2023-09-30

## 2023-09-30 MED ORDER — LOSARTAN POTASSIUM-HCTZ 50-12.5 MG PO TABS: 1.0000 | ORAL_TABLET | Freq: Every day | ORAL | 0 refills | Status: DC

## 2023-09-30 MED ORDER — PANTOPRAZOLE SODIUM 40 MG PO TBEC: 40.0000 mg | DELAYED_RELEASE_TABLET | Freq: Every day | ORAL | 1 refills | Status: DC

## 2023-09-30 MED ORDER — AMLODIPINE BESYLATE 10 MG PO TABS: 10.0000 mg | ORAL_TABLET | Freq: Every day | ORAL | 1 refills | Status: AC

## 2023-09-30 MED ORDER — PANTOPRAZOLE SODIUM 40 MG PO TBEC: 40.0000 mg | DELAYED_RELEASE_TABLET | Freq: Every day | ORAL | 1 refills | Status: AC

## 2023-09-30 MED ORDER — BUSPIRONE HCL 10 MG PO TABS: 5.0000 mg | ORAL_TABLET | Freq: Two times a day (BID) | ORAL | 2 refills | Status: DC | PRN

## 2023-09-30 MED ORDER — SERTRALINE HCL 50 MG PO TABS: 50.0000 mg | ORAL_TABLET | Freq: Every day | ORAL | 3 refills | Status: AC

## 2023-09-30 NOTE — Assessment & Plan Note (Signed)
 Uncontrolled hypertension due to medication noncompliance causing elevated blood pressure. Restarting previous medications expected to manage urgency and alleviate symptoms. - Restart amlodipine  10, losartan -hydrochlorothiazide 50-12.5 - Recheck blood pressure in 2-3 weeks in office, if still >130/80 provider will need to be seen and meds adjusted with additional close f/up - Adjust losartan -hydrochlorothiazide dose if blood pressure remains elevated. BP Readings from Last 3 Encounters:  09/30/23 (!) 168/102  10/01/22 (!) 142/86  01/06/22 (!) 133/103  Ha currently sounds like tension HA, may be more related to stress/tension, lack of sleep, no other sx or concerns for end organ damage or htn emergency, no SOB, CP. LE edema Labs today and close f/up Urged better med compliance, f/up appts and work on [employer] and managing stress in life

## 2023-09-30 NOTE — Assessment & Plan Note (Signed)
 Moves up neck and back of head, suspect tension HA, may also be related to lack of sleep, stress, neck msk tension or past migraines, lack of sleep, HTN Tx htn and f/up to see if HA's improve Other eval may be appropriate like muscle relaxers and sleep study to r/o OSA

## 2023-09-30 NOTE — Progress Notes (Signed)
 Name: April Cross   MRN: [id number]    DOB: [birth year]   Date:09/30/2023       Progress Note  Chief Complaint  Patient presents with   Medical Management of Chronic Issues   Hypertension   Headache    Comes/go for awhile. Hx of migraines     Subjective:   April Cross is a 63 y.o. female, presents to clinic for routine follow up on chronic conditions- not seen since 2023, out of meds and lost to f/up Largely due to work stress and not making time for herself or appts  BP elevated today, out of meds BP Readings from Last 10 Encounters:  09/30/23 (!) 168/102  10/01/22 (!) 142/86  01/06/22 (!) 133/103  10/28/21 (!) 154/96  08/31/19 132/74  04/04/19 122/80  02/27/19 (!) 138/100  01/16/19 118/82  01/02/19 (!) 168/112  11/29/17 (!) 140/98   Hx of work stress, anxiety and depression, sx int he past were managed with buspar  and zoloft  from 2019-2023  Discussed the use of AI scribe software for clinical note transcription with the patient, who gave verbal consent to proceed.  History of Present Illness April Cross is a 63 year old female with hypertension who presents with high blood pressure and headaches.  Hypertension - Elevated blood pressure since August 2023 after running out of amlodipine  and losartan  hydrochlorothiazide - Previously well-controlled on current antihypertensive regimen No LE edema, CP, SOB Ha's?  Headache Different than past migraines - Severe, tension-type headaches - Associated with elevated blood pressure?  - No mention of visual changes, nausea, or vomiting  Anxiety and sleep disturbance - Persistent anxiety and work-related stress impacting sleep - Occasional use of Benadryl for sleep - Previous benefit from sertraline  for anxiety and sleep  Chest pressure and gastroesophageal symptoms - Chest pressure occurring with eating - Symptoms relieved by Zantac - Suggestive of possible acid reflux - No mention of  exertional chest pain, shortness of breath, or palpitations       Current Outpatient Medications:    amLODipine  (NORVASC ) 10 MG tablet, Take 1 tablet (10 mg total) by mouth daily. (Patient not taking: Reported on 09/30/2023), Disp: 90 tablet, Rfl: 3   busPIRone  (BUSPAR ) 10 MG tablet, Take 0.5-1 tablets (5-10 mg total) by mouth 2 (two) times daily as needed. (Patient not taking: Reported on 09/30/2023), Disp: 180 tablet, Rfl: 2   cholecalciferol (VITAMIN D3) 25 MCG (1000 UT) tablet, Take 1,000 Units by mouth daily. (Patient not taking: Reported on 09/30/2023), Disp: , Rfl:    LORazepam  (ATIVAN ) 0.5 MG tablet, Take 0.5-1 tablets (0.25-0.5 mg total) by mouth 2 (two) times daily as needed for anxiety. (Patient not taking: Reported on 09/30/2023), Disp: 20 tablet, Rfl: 0   losartan -hydrochlorothiazide (HYZAAR) 50-12.5 MG tablet, Take 1 tablet by mouth daily. (Patient not taking: Reported on 09/30/2023), Disp: 90 tablet, Rfl: 3   methocarbamol  (ROBAXIN ) 500 MG tablet, Take 1 tablet (500 mg total) by mouth every 8 (eight) hours as needed for muscle spasms. (Patient not taking: Reported on 09/30/2023), Disp: 60 tablet, Rfl: 3   sertraline  (ZOLOFT ) 50 MG tablet, Take 1 tablet (50 mg total) by mouth daily. (Patient not taking: Reported on 09/30/2023), Disp: 90 tablet, Rfl: 3  Patient Active Problem List   Diagnosis Date Noted   Essential hypertension 10/28/2021   Muscle spasm 10/28/2021   Anxiety disorder 01/16/2019    Past Surgical History:  Procedure Laterality Date   COLONOSCOPY WITH PROPOFOL  N/A 07/08/2017   Procedure:  COLONOSCOPY WITH PROPOFOL ;  Surgeon: Unk Corinn Skiff, MD;  Location: [facility] ENDOSCOPY;  Service: Endoscopy;  Laterality: N/A;   WRIST SURGERY      Family History  Problem Relation Age of Onset   Brain cancer Paternal Uncle    Lung cancer Paternal Uncle    Leukemia Maternal Grandmother    Ovarian cancer Maternal Grandmother    Breast cancer Paternal Grandfather    Diabetes  Mother    Hypertension Mother    Colon cancer Sister    Thyroid  disease Sister    Asthma Son     Social History   Tobacco Use   Smoking status: Never   Smokeless tobacco: Never  Vaping Use   Vaping status: Never Used  Substance Use Topics   Alcohol use: No   Drug use: No     Allergies  Allergen Reactions   Penicillins     Health Maintenance  Topic Date Due   Hepatitis C Screening  Never done   Cervical Cancer Screening (HPV/Pap Cotest)  05/22/2022   Colonoscopy  07/09/2022   COVID-19 Vaccine (3 - 2024-25 season) 10/15/2023 (Originally 11/15/2022)   Zoster Vaccines- Shingrix (1 of 2) 12/30/2023 (Originally 06/08/2012)   INFLUENZA VACCINE  10/15/2023   MAMMOGRAM  01/07/2024   DTaP/Tdap/Td (3 - Td or Tdap) 03/17/2027   HIV Screening  Completed   Hepatitis B Vaccines  Aged Out   HPV VACCINES  Aged Out   Meningococcal B Vaccine  Aged Out    Chart Review Today: I personally reviewed active problem list, medication list, allergies, family history, social history, health maintenance, notes from last encounter, lab results, imaging with the patient/caregiver today.   Review of Systems  Constitutional: Negative.   HENT: Negative.    Eyes: Negative.   Respiratory: Negative.    Cardiovascular: Negative.   Gastrointestinal: Negative.   Endocrine: Negative.   Genitourinary: Negative.   Musculoskeletal: Negative.   Skin: Negative.   Allergic/Immunologic: Negative.   Neurological: Negative.   Hematological: Negative.   Psychiatric/Behavioral: Negative.    All other systems reviewed and are negative.    Objective:   Vitals:   09/30/23 0819 09/30/23 0832  BP: (!) 174/106 (!) 168/102  Pulse: 86   Resp: 16   SpO2: 97%   Weight: 192 lb (87.1 kg)   Height: 5' 7 (1.702 m)     Body mass index is 30.07 kg/m.  Physical Exam Vitals and nursing note reviewed.  Constitutional:      General: She is not in acute distress.    Appearance: Normal appearance. She is  well-developed. She is not ill-appearing, toxic-appearing or diaphoretic.  HENT:     Head: Normocephalic and atraumatic.     Right Ear: External ear normal.     Left Ear: External ear normal.     Nose: Nose normal.  Eyes:     General: No scleral icterus.       Right eye: No discharge.        Left eye: No discharge.     Conjunctiva/sclera: Conjunctivae normal.  Neck:     Trachea: No tracheal deviation.  Cardiovascular:     Rate and Rhythm: Normal rate and regular rhythm.     Pulses: Normal pulses.     Heart sounds: Normal heart sounds. No murmur heard.    No friction rub. No gallop.  Pulmonary:     Effort: Pulmonary effort is normal. No respiratory distress.     Breath sounds: Normal breath sounds. No stridor.  No wheezing, rhonchi or rales.  Musculoskeletal:     Cervical back: Normal range of motion.     Right lower leg: No edema.     Left lower leg: No edema.  Skin:    General: Skin is warm and dry.     Findings: No rash.  Neurological:     Mental Status: She is alert.     Motor: No abnormal muscle tone.     Coordination: Coordination normal.     Gait: Gait normal.  Psychiatric:        Mood and Affect: Mood normal.        Behavior: Behavior normal.      Functional Status Survey: Is the patient deaf or have difficulty hearing?: No Does the patient have difficulty seeing, even when wearing glasses/contacts?: No Does the patient have difficulty concentrating, remembering, or making decisions?: No Does the patient have difficulty walking or climbing stairs?: No Does the patient have difficulty dressing or bathing?: No Does the patient have difficulty doing errands alone such as visiting a doctor's office or shopping?: No Results for orders placed or performed in visit on 08/31/19  BASIC METABOLIC PANEL WITH GFR   Collection Time: 08/31/19 10:34 AM  Result Value Ref Range   Glucose, Bld 94 65 - 99 mg/dL   BUN 13 7 - 25 mg/dL   Creat 8.99 9.49 - 8.94 mg/dL   GFR, Est  Non African American 62 > OR = 60 mL/min/1.71m2   GFR, Est African American 72 > OR = 60 mL/min/1.41m2   BUN/Creatinine Ratio NOT APPLICABLE 6 - 22 (calc)   Sodium 140 135 - 146 mmol/L   Potassium 4.3 3.5 - 5.3 mmol/L   Chloride 105 98 - 110 mmol/L   CO2 30 20 - 32 mmol/L   Calcium 9.9 8.6 - 10.4 mg/dL  VITAMIN D  25 Hydroxy (Vit-D Deficiency, Fractures)   Collection Time: 08/31/19 10:34 AM  Result Value Ref Range   Vit D, 25-Hydroxy 21 (L) 30 - 100 ng/mL      Assessment & Plan:    Assessment & Plan Encounter for medication monitoring -     CBC with Differential/Platelet -     Comprehensive metabolic panel with GFR -     Lipid panel  Essential hypertension Assessment & Plan: Uncontrolled hypertension due to medication noncompliance causing elevated blood pressure. Restarting previous medications expected to manage urgency and alleviate symptoms. - Restart amlodipine  10, losartan -hydrochlorothiazide 50-12.5 - Recheck blood pressure in 2-3 weeks in office, if still >130/80 provider will need to be seen and meds adjusted with additional close f/up - Adjust losartan -hydrochlorothiazide dose if blood pressure remains elevated. BP Readings from Last 3 Encounters:  09/30/23 (!) 168/102  10/01/22 (!) 142/86  01/06/22 (!) 133/103  Ha currently sounds like tension HA, may be more related to stress/tension, lack of sleep, no other sx or concerns for end organ damage or htn emergency, no SOB, CP. LE edema Labs today and close f/up Urged better med compliance, f/up appts and work on [employer] and managing stress in life   Orders: -     Comprehensive metabolic panel with GFR -     amLODIPine  Besylate; Take 1 tablet (10 mg total) by mouth daily.  Dispense: 90 tablet; Refill: 1 -     Losartan  Potassium-HCTZ; Take 1 tablet by mouth daily.  Dispense: 90 tablet; Refill: 0  Other mixed anxiety disorders Assessment & Plan: Poorly controlled, work a significant constant stressor Previously on  zoloft  and  buspar     09/30/2023    8:21 AM 10/31/2021    9:17 AM 10/28/2021    9:04 AM  Depression screen PHQ 2/9  Decreased Interest 0 0 0  Down, Depressed, Hopeless 0 0 0  PHQ - 2 Score 0 0 0  Altered sleeping   0  Tired, decreased energy   0  Change in appetite   0  Feeling bad or failure about yourself    0  Trouble concentrating   0  Moving slowly or fidgety/restless   0  Suicidal thoughts   0  PHQ-9 Score   0  Difficult doing work/chores   Not difficult at all      10/28/2021    9:09 AM 08/31/2019    9:55 AM 04/04/2019    1:15 PM 01/16/2019   11:57 AM  GAD 7 : Generalized Anxiety Score  Nervous, Anxious, on Edge 1 3 1 1   Control/stop worrying 0 0 1 0  Worry too much - different things 0 0 1 1  Trouble relaxing 0 0 0 0  Restless 0 0 0 0  Easily annoyed or irritable 0 0 2 1  Afraid - awful might happen 0 0 1 0  Total GAD 7 Score 1 3 6 3   Anxiety Difficulty Not difficult at all  Somewhat difficult Not difficult at all   Restart meds, 1/2 dose zoloft  for 2 weeks then increase to 50 mg, use buspar  prn, coping skills, resources and referrals offered and available Persistent stress, difficulty sleeping, and tension headaches. Restarting sertraline  expected to manage anxiety and improve sleep. Buspar  recommended for acute anxiety. - Restart sertraline  at half dose for 1-2 weeks, then increase to full dose. - Use Buspar  as needed for acute anxiety.   Orders: -     busPIRone  HCl; Take 0.5-1 tablets (5-10 mg total) by mouth 2 (two) times daily as needed.  Dispense: 180 tablet; Refill: 2 -     Sertraline  HCl; Take 1 tablet (50 mg total) by mouth daily.  Dispense: 90 tablet; Refill: 3  Screening for viral disease -     HIV Antibody (routine testing w rflx) -     Hepatitis C antibody  Colon cancer screening -     Ambulatory referral to Gastroenterology  Dysphagia, unspecified type Some trouble swallowing in chest feels like things sometimes gets stuck, OTC zantac was helping,  pt can do trial of PPI  -     Pantoprazole  Sodium; Take 1 tablet (40 mg total) by mouth daily.  Dispense: 90 tablet; Refill: 1  Tension-type headache, not intractable, unspecified chronicity pattern Assessment & Plan: Moves up neck and back of head, suspect tension HA, may also be related to lack of sleep, stress, neck msk tension or past migraines, lack of sleep, HTN Tx htn and f/up to see if HA's improve Other eval may be appropriate like muscle relaxers and sleep study to r/o OSA  Tension headache Headaches likely tension-related, exacerbated by hypertension and stress. Managing blood pressure and anxiety expected to alleviate headaches. - Manage blood pressure and anxiety as outlined in respective plans. - Use heat therapy for neck and shoulder tension. - Evaluate headache improvement at follow-up; consider further evaluation if persistent. - Consider heat therapy for neck and shoulder tension.  General Health Maintenance No labs in four years. Regular monitoring important due to hypertension and anxiety. - Perform lab tests today.  Follow-up Follow-up crucial to monitor blood pressure, evaluate treatment effectiveness, and address persistent symptoms. -  Schedule follow-up in 2-3 weeks for blood pressure recheck and lab review. - Schedule appointment for headache evaluation if headaches persist.  Recording duration: 20 minutes   Return for 2-3 week nurse BP f/up, needs OV if >130/80 or continued HA's .     Michelene Cower, PA-C 09/30/23 8:38 AM

## 2023-09-30 NOTE — Assessment & Plan Note (Signed)
 Poorly controlled, work a significant constant stressor Previously on zoloft  and buspar     09/30/2023    8:21 AM 10/31/2021    9:17 AM 10/28/2021    9:04 AM  Depression screen PHQ 2/9  Decreased Interest 0 0 0  Down, Depressed, Hopeless 0 0 0  PHQ - 2 Score 0 0 0  Altered sleeping   0  Tired, decreased energy   0  Change in appetite   0  Feeling bad or failure about yourself    0  Trouble concentrating   0  Moving slowly or fidgety/restless   0  Suicidal thoughts   0  PHQ-9 Score   0  Difficult doing work/chores   Not difficult at all      10/28/2021    9:09 AM 08/31/2019    9:55 AM 04/04/2019    1:15 PM 01/16/2019   11:57 AM  GAD 7 : Generalized Anxiety Score  Nervous, Anxious, on Edge 1 3 1 1   Control/stop worrying 0 0 1 0  Worry too much - different things 0 0 1 1  Trouble relaxing 0 0 0 0  Restless 0 0 0 0  Easily annoyed or irritable 0 0 2 1  Afraid - awful might happen 0 0 1 0  Total GAD 7 Score 1 3 6 3   Anxiety Difficulty Not difficult at all  Somewhat difficult Not difficult at all   Restart meds, 1/2 dose zoloft  for 2 weeks then increase to 50 mg, use buspar  prn, coping skills, resources and referrals offered and available Persistent stress, difficulty sleeping, and tension headaches. Restarting sertraline  expected to manage anxiety and improve sleep. Buspar  recommended for acute anxiety. - Restart sertraline  at half dose for 1-2 weeks, then increase to full dose. - Use Buspar  as needed for acute anxiety.

## 2023-10-01 ENCOUNTER — Ambulatory Visit: Payer: Self-pay | Admitting: Family Medicine

## 2023-10-01 LAB — COMPREHENSIVE METABOLIC PANEL WITH GFR
AG Ratio: 2 (calc) (ref 1.0–2.5)
ALT: 12 U/L (ref 6–29)
AST: 16 U/L (ref 10–35)
Albumin: 4.2 g/dL (ref 3.6–5.1)
Alkaline phosphatase (APISO): 58 U/L (ref 37–153)
BUN: 17 mg/dL (ref 7–25)
CO2: 26 mmol/L (ref 20–32)
Calcium: 9.5 mg/dL (ref 8.6–10.4)
Chloride: 107 mmol/L (ref 98–110)
Creat: 0.89 mg/dL (ref 0.50–1.05)
Globulin: 2.1 g/dL (ref 1.9–3.7)
Glucose, Bld: 86 mg/dL (ref 65–99)
Potassium: 4.2 mmol/L (ref 3.5–5.3)
Sodium: 139 mmol/L (ref 135–146)
Total Bilirubin: 0.8 mg/dL (ref 0.2–1.2)
Total Protein: 6.3 g/dL (ref 6.1–8.1)
eGFR: 74 mL/min/1.73m2 (ref >=60)

## 2023-10-01 LAB — CBC WITH DIFFERENTIAL/PLATELET
Absolute Lymphocytes: 1452 {cells}/uL (ref 850–3900)
Absolute Monocytes: 314 {cells}/uL (ref 200–950)
Basophils Absolute: 30 {cells}/uL (ref 0–200)
Basophils Relative: 0.9 %
Eosinophils Absolute: 59 {cells}/uL (ref 15–500)
Eosinophils Relative: 1.8 %
HCT: 35.2 % (ref 35.0–45.0)
Hemoglobin: 11.4 g/dL — ABNORMAL LOW (ref 11.7–15.5)
MCH: 29.8 pg (ref 27.0–33.0)
MCHC: 32.4 g/dL (ref 32.0–36.0)
MCV: 91.9 fL (ref 80.0–100.0)
MPV: 11.9 fL (ref 7.5–12.5)
Monocytes Relative: 9.5 %
Neutro Abs: 1445 {cells}/uL — ABNORMAL LOW (ref 1500–7800)
Neutrophils Relative %: 43.8 %
Platelets: 217 Thousand/uL (ref 140–400)
RBC: 3.83 Million/uL (ref 3.80–5.10)
RDW: 12.8 % (ref 11.0–15.0)
Total Lymphocyte: 44 %
WBC: 3.3 Thousand/uL — ABNORMAL LOW (ref 3.8–10.8)

## 2023-10-01 LAB — HEPATITIS C ANTIBODY: Hepatitis C Ab: NONREACTIVE

## 2023-10-01 LAB — LIPID PANEL
Cholesterol: 150 mg/dL (ref <200)
HDL: 71 mg/dL (ref >=50)
LDL Cholesterol (Calc): 66 mg/dL
Non-HDL Cholesterol (Calc): 79 mg/dL (ref <130)
Total CHOL/HDL Ratio: 2.1 (calc) (ref <5.0)
Triglycerides: 44 mg/dL (ref <150)

## 2023-10-01 LAB — HIV ANTIBODY (ROUTINE TESTING W REFLEX): HIV 1&2 Ab, 4th Generation: NONREACTIVE

## 2023-10-22 ENCOUNTER — Ambulatory Visit: Payer: Self-pay | Admitting: Family Medicine

## 2023-10-22 ENCOUNTER — Encounter: Payer: Self-pay | Admitting: Family Medicine

## 2023-10-22 VITALS — BP 122/74 | HR 92 | Resp 16 | Ht 67.0 in | Wt 187.0 lb

## 2023-10-22 DIAGNOSIS — Z566 Other physical and mental strain related to work: Secondary | ICD-10-CM

## 2023-10-22 DIAGNOSIS — R5383 Other fatigue: Secondary | ICD-10-CM

## 2023-10-22 DIAGNOSIS — J0140 Acute pansinusitis, unspecified: Secondary | ICD-10-CM

## 2023-10-22 DIAGNOSIS — F413 Other mixed anxiety disorders: Secondary | ICD-10-CM

## 2023-10-22 DIAGNOSIS — I10 Essential (primary) hypertension: Secondary | ICD-10-CM

## 2023-10-22 DIAGNOSIS — B349 Viral infection, unspecified: Secondary | ICD-10-CM

## 2023-10-22 DIAGNOSIS — R519 Headache, unspecified: Secondary | ICD-10-CM

## 2023-10-22 DIAGNOSIS — K219 Gastro-esophageal reflux disease without esophagitis: Secondary | ICD-10-CM

## 2023-10-22 MED ORDER — DOXYCYCLINE HYCLATE 100 MG PO TABS: 100.0000 mg | ORAL_TABLET | Freq: Two times a day (BID) | ORAL | 0 refills | Status: AC

## 2023-10-22 NOTE — Progress Notes (Signed)
 Name: April Cross   MRN: [id number]    DOB: 1960/09/13   Date:10/22/2023       Progress Note  Chief Complaint  Patient presents with   Follow-up    3 week     Subjective:   April Cross is a 63 y.o. female, presents to clinic to BP recheck Past OV A&P: Assessment & Plan Encounter for medication monitoring -     CBC with Differential/Platelet -     Comprehensive metabolic panel with GFR -     Lipid panel   Essential hypertension Assessment & Plan: Uncontrolled hypertension due to medication noncompliance causing elevated blood pressure. Restarting previous medications expected to manage urgency and alleviate symptoms. - Restart amlodipine  10, losartan -hydrochlorothiazide 50-12.5 - Recheck blood pressure in 2-3 weeks in office, if still >130/80 provider will need to be seen and meds adjusted with additional close f/up - Adjust losartan -hydrochlorothiazide dose if blood pressure remains elevated.    BP Readings from Last 3 Encounters:  09/30/23 (!) 168/102  10/01/22 (!) 142/86  01/06/22 (!) 133/103  Ha currently sounds like tension HA, may be more related to stress/tension, lack of sleep, no other sx or concerns for end organ damage or htn emergency, no SOB, CP. LE edema Labs today and close f/up Urged better med compliance, f/up appts and work on [employer] and managing stress in life     Orders: -     Comprehensive metabolic panel with GFR -     amLODIPine  Besylate; Take 1 tablet (10 mg total) by mouth daily.  Dispense: 90 tablet; Refill: 1 -     Losartan  Potassium-HCTZ; Take 1 tablet by mouth daily.  Dispense: 90 tablet; Refill: 0   Other mixed anxiety disorders Assessment & Plan: Poorly controlled, work a significant constant stressor Previously on zoloft  and buspar      09/30/2023    8:21 AM 10/31/2021    9:17 AM 10/28/2021    9:04 AM  Depression screen PHQ 2/9  Decreased Interest 0 0 0  Down, Depressed, Hopeless 0 0 0  PHQ - 2 Score 0 0 0  Altered  sleeping     0  Tired, decreased energy     0  Change in appetite     0  Feeling bad or failure about yourself      0  Trouble concentrating     0  Moving slowly or fidgety/restless     0  Suicidal thoughts     0  PHQ-9 Score     0  Difficult doing work/chores     Not difficult at all        10/28/2021    9:09 AM 08/31/2019    9:55 AM 04/04/2019    1:15 PM 01/16/2019   11:57 AM  GAD 7 : Generalized Anxiety Score  Nervous, Anxious, on Edge 1 3 1 1   Control/stop worrying 0 0 1 0  Worry too much - different things 0 0 1 1  Trouble relaxing 0 0 0 0  Restless 0 0 0 0  Easily annoyed or irritable 0 0 2 1  Afraid - awful might happen 0 0 1 0  Total GAD 7 Score 1 3 6 3   Anxiety Difficulty Not difficult at all   Somewhat difficult Not difficult at all    Restart meds, 1/2 dose zoloft  for 2 weeks then increase to 50 mg, use buspar  prn, coping skills, resources and referrals offered and available Persistent stress, difficulty sleeping, and  tension headaches. Restarting sertraline  expected to manage anxiety and improve sleep. Buspar  recommended for acute anxiety. - Restart sertraline  at half dose for 1-2 weeks, then increase to full dose. - Use Buspar  as needed for acute anxiety.     Orders: -     busPIRone  HCl; Take 0.5-1 tablets (5-10 mg total) by mouth 2 (two) times daily as needed.  Dispense: 180 tablet; Refill: 2 -     Sertraline  HCl; Take 1 tablet (50 mg total) by mouth daily.  Dispense: 90 tablet; Refill: 3   Screening for viral disease -     HIV Antibody (routine testing w rflx) -     Hepatitis C antibody   Colon cancer screening -     Ambulatory referral to Gastroenterology   Dysphagia, unspecified type Some trouble swallowing in chest feels like things sometimes gets stuck, OTC zantac was helping, pt can do trial of PPI  -     Pantoprazole  Sodium; Take 1 tablet (40 mg total) by mouth daily.  Dispense: 90 tablet; Refill: 1   Tension-type headache, not intractable, unspecified  chronicity pattern Assessment & Plan: Moves up neck and back of head, suspect tension HA, may also be related to lack of sleep, stress, neck msk tension or past migraines, lack of sleep, HTN Tx htn and f/up to see if HA's improve Other eval may be appropriate like muscle relaxers and sleep study to r/o OSA  Tension headache Headaches likely tension-related, exacerbated by hypertension and stress. Managing blood pressure and anxiety expected to alleviate headaches. - Manage blood pressure and anxiety as outlined in respective plans. - Use heat therapy for neck and shoulder tension. - Evaluate headache improvement at follow-up; consider further evaluation if persistent. - Consider heat therapy for neck and shoulder tension.   General Health Maintenance No labs in four years. Regular monitoring important due to hypertension and anxiety. - Perform lab tests today.   Follow-up Follow-up crucial to monitor blood pressure, evaluate treatment effectiveness, and address persistent symptoms. - Schedule follow-up in 2-3 weeks for blood pressure recheck and lab review. - Schedule appointment for headache evaluation if headaches persist.   Recording duration: 20 minutes     Return for 2-3 week nurse BP f/up, needs OV if >130/80 or continued HA's .   Here bp is much better on meds BP Readings from Last 3 Encounters:  10/22/23 122/74  09/30/23 (!) 168/102  10/01/22 (!) 142/86    She has been sick and tired for the past week - pushed through and continued to work       09/30/2023    8:21 AM 10/31/2021    9:17 AM 10/28/2021    9:04 AM  Depression screen PHQ 2/9  Decreased Interest 0 0 0  Down, Depressed, Hopeless 0 0 0  PHQ - 2 Score 0 0 0  Altered sleeping   0  Tired, decreased energy   0  Change in appetite   0  Feeling bad or failure about yourself    0  Trouble concentrating   0  Moving slowly or fidgety/restless   0  Suicidal thoughts   0  PHQ-9 Score   0  Difficult doing  work/chores   Not difficult at all      10/28/2021    9:09 AM 08/31/2019    9:55 AM 04/04/2019    1:15 PM 01/16/2019   11:57 AM  GAD 7 : Generalized Anxiety Score  Nervous, Anxious, on Edge 1 3 1  1  Control/stop worrying 0 0 1 0  Worry too much - different things 0 0 1 1  Trouble relaxing 0 0 0 0  Restless 0 0 0 0  Easily annoyed or irritable 0 0 2 1  Afraid - awful might happen 0 0 1 0  Total GAD 7 Score 1 3 6 3   Anxiety Difficulty Not difficult at all  Somewhat difficult Not difficult at all    Discussed the use of AI scribe software for clinical note transcription with the patient, who gave verbal consent to proceed.  History of Present Illness Tressie Ragin is a 63 year old female with hypertension who presents with persistent sinus infection symptoms and fatigue.  Upper respiratory symptoms - Sinus infection symptoms present for over one week - Significant facial and head pressure - Nasal congestion - Scratchy throat - Uses Zyrtec, Allegra, and Benadryl without significant relief - Gargles with peroxide for throat discomfort  Ocular symptoms - Red, irritated, and burning eyes - Uses eye drops for symptom relief - Tastes eye drops in throat after application  Fatigue - Extreme fatigue - Attributes fatigue in part to demanding job and long work hours - Difficulty taking time off work  Headache - Headaches possibly related to stress, tension, or blood pressure - Relief with one of her medications, unsure which  Hypertension - Takes amlodipine  10 mg and losartan  hydrochlorothiazide for blood pressure control  Anxiety and stress - Recently restarted Zoloft  and BuSpar  at half dose for stress and anxiety  Dysphagia and gastroesophageal symptoms - Takes pantoprazole  for trouble swallowing - Improvement in swallowing with pantoprazole  - Experiences discomfort with eating or drinking if pantoprazole  is missed     Current Outpatient Medications:     amLODipine  (NORVASC ) 10 MG tablet, Take 1 tablet (10 mg total) by mouth daily., Disp: 90 tablet, Rfl: 1   busPIRone  (BUSPAR ) 10 MG tablet, Take 0.5-1 tablets (5-10 mg total) by mouth 2 (two) times daily as needed., Disp: 180 tablet, Rfl: 2   losartan -hydrochlorothiazide (HYZAAR) 50-12.5 MG tablet, Take 1 tablet by mouth daily., Disp: 90 tablet, Rfl: 0   pantoprazole  (PROTONIX ) 40 MG tablet, Take 1 tablet (40 mg total) by mouth daily., Disp: 90 tablet, Rfl: 1   sertraline  (ZOLOFT ) 50 MG tablet, Take 1 tablet (50 mg total) by mouth daily., Disp: 90 tablet, Rfl: 3   cholecalciferol (VITAMIN D3) 25 MCG (1000 UT) tablet, Take 1,000 Units by mouth daily. (Patient not taking: Reported on 09/30/2023), Disp: , Rfl:    LORazepam  (ATIVAN ) 0.5 MG tablet, Take 0.5-1 tablets (0.25-0.5 mg total) by mouth 2 (two) times daily as needed for anxiety. (Patient not taking: Reported on 09/30/2023), Disp: 20 tablet, Rfl: 0   methocarbamol  (ROBAXIN ) 500 MG tablet, Take 1 tablet (500 mg total) by mouth every 8 (eight) hours as needed for muscle spasms. (Patient not taking: Reported on 09/30/2023), Disp: 60 tablet, Rfl: 3  Patient Active Problem List   Diagnosis Date Noted   Tension-type headache, not intractable 09/30/2023   Essential hypertension 10/28/2021   Muscle spasm 10/28/2021   Anxiety disorder 01/16/2019    Past Surgical History:  Procedure Laterality Date   COLONOSCOPY WITH PROPOFOL  N/A 07/08/2017   Procedure: COLONOSCOPY WITH PROPOFOL ;  Surgeon: Unk Corinn Skiff, MD;  Location: [facility] ENDOSCOPY;  Service: Endoscopy;  Laterality: N/A;   WRIST SURGERY      Family History  Problem Relation Age of Onset   Brain cancer Paternal Uncle    Lung cancer Paternal Uncle  Leukemia Maternal Grandmother    Ovarian cancer Maternal Grandmother    Breast cancer Paternal Grandfather    Diabetes Mother    Hypertension Mother    Colon cancer Sister    Thyroid  disease Sister    Asthma Son     Social History    Tobacco Use   Smoking status: Never   Smokeless tobacco: Never  Vaping Use   Vaping status: Never Used  Substance Use Topics   Alcohol use: No   Drug use: No     Allergies  Allergen Reactions   Penicillins     Health Maintenance  Topic Date Due   Pneumococcal Vaccine: 50+ Years (1 of 1 - PCV) Never done   Cervical Cancer Screening (HPV/Pap Cotest)  05/22/2022   Colonoscopy  07/09/2022   COVID-19 Vaccine (3 - 2024-25 season) 11/15/2022   INFLUENZA VACCINE  10/15/2023   Zoster Vaccines- Shingrix (1 of 2) 12/30/2023 (Originally 06/08/2012)   MAMMOGRAM  01/07/2024   DTaP/Tdap/Td (3 - Td or Tdap) 03/17/2027   Hepatitis C Screening  Completed   HIV Screening  Completed   Hepatitis B Vaccines  Aged Out   HPV VACCINES  Aged Out   Meningococcal B Vaccine  Aged Out    Chart Review Today: I personally reviewed active problem list, medication list, allergies, family history, social history, health maintenance, notes from last encounter, lab results, imaging with the patient/caregiver today.    Review of Systems  Constitutional: Negative.   HENT: Negative.    Eyes: Negative.   Respiratory: Negative.    Cardiovascular: Negative.   Gastrointestinal: Negative.   Endocrine: Negative.   Genitourinary: Negative.   Musculoskeletal: Negative.   Skin: Negative.   Allergic/Immunologic: Negative.   Neurological: Negative.   Hematological: Negative.   Psychiatric/Behavioral: Negative.    All other systems reviewed and are negative.    Objective:   Vitals:   10/22/23 1521  BP: 122/74  Pulse: 92  Resp: 16  SpO2: 98%  Weight: 187 lb (84.8 kg)  Height: 5' 7 (1.702 m)    Body mass index is 29.29 kg/m.  Physical Exam Vitals and nursing note reviewed.  Constitutional:      General: She is not in acute distress.    Appearance: Normal appearance. She is well-developed. She is not ill-appearing, toxic-appearing or diaphoretic.  HENT:     Head: Normocephalic and  atraumatic.     Right Ear: External ear normal.     Left Ear: External ear normal.     Nose: Nose normal.  Eyes:     General: No scleral icterus.       Right eye: No discharge.        Left eye: No discharge.     Conjunctiva/sclera: Conjunctivae normal.  Neck:     Trachea: No tracheal deviation.  Cardiovascular:     Rate and Rhythm: Normal rate and regular rhythm.     Pulses: Normal pulses.     Heart sounds: Normal heart sounds.  Pulmonary:     Effort: Pulmonary effort is normal. No respiratory distress.     Breath sounds: No stridor.  Skin:    General: Skin is warm and dry.     Findings: No rash.  Neurological:     Mental Status: She is alert.     Motor: No abnormal muscle tone.     Coordination: Coordination normal.     Gait: Gait normal.  Psychiatric:        Mood and Affect: Mood  normal.        Behavior: Behavior normal.      Functional Status Survey:   Results for orders placed or performed in visit on 09/30/23  HIV antibody (with reflex)   Collection Time: 09/30/23  9:09 AM  Result Value Ref Range   HIV FINAL INTERPRETATION     HIV 1&2 Ab, 4th Generation NON-REACTIVE NON-REACTIVE  Hepatitis C Antibody   Collection Time: 09/30/23  9:09 AM  Result Value Ref Range   Hepatitis C Ab NON-REACTIVE NON-REACTIVE  CBC with Differential/Platelet   Collection Time: 09/30/23  9:09 AM  Result Value Ref Range   WBC 3.3 (L) 3.8 - 10.8 Thousand/uL   RBC 3.83 3.80 - 5.10 Million/uL   Hemoglobin 11.4 (L) 11.7 - 15.5 g/dL   HCT 64.7 64.9 - 54.9 %   MCV 91.9 80.0 - 100.0 fL   MCH 29.8 27.0 - 33.0 pg   MCHC 32.4 32.0 - 36.0 g/dL   RDW 87.1 88.9 - 84.9 %   Platelets 217 140 - 400 Thousand/uL   MPV 11.9 7.5 - 12.5 fL   Neutro Abs 1,445 (L) 1,500 - 7,800 cells/uL   Absolute Lymphocytes 1,452 850 - 3,900 cells/uL   Absolute Monocytes 314 200 - 950 cells/uL   Eosinophils Absolute 59 15 - 500 cells/uL   Basophils Absolute 30 0 - 200 cells/uL   Neutrophils Relative % 43.8 %    Total Lymphocyte 44.0 %   Monocytes Relative 9.5 %   Eosinophils Relative 1.8 %   Basophils Relative 0.9 %  Comprehensive Metabolic Panel (CMET)   Collection Time: 09/30/23  9:09 AM  Result Value Ref Range   Glucose, Bld 86 65 - 99 mg/dL   BUN 17 7 - 25 mg/dL   Creat 9.10 9.49 - 8.94 mg/dL   eGFR 74 > OR = 60 fO/fpw/8.26f7   BUN/Creatinine Ratio SEE NOTE: 6 - 22 (calc)   Sodium 139 135 - 146 mmol/L   Potassium 4.2 3.5 - 5.3 mmol/L   Chloride 107 98 - 110 mmol/L   CO2 26 20 - 32 mmol/L   Calcium 9.5 8.6 - 10.4 mg/dL   Total Protein 6.3 6.1 - 8.1 g/dL   Albumin 4.2 3.6 - 5.1 g/dL   Globulin 2.1 1.9 - 3.7 g/dL (calc)   AG Ratio 2.0 1.0 - 2.5 (calc)   Total Bilirubin 0.8 0.2 - 1.2 mg/dL   Alkaline phosphatase (APISO) 58 37 - 153 U/L   AST 16 10 - 35 U/L   ALT 12 6 - 29 U/L  Lipid Profile   Collection Time: 09/30/23  9:09 AM  Result Value Ref Range   Cholesterol 150 <200 mg/dL   HDL 71 > OR = 50 mg/dL   Triglycerides 44 <849 mg/dL   LDL Cholesterol (Calc) 66 mg/dL (calc)   Total CHOL/HDL Ratio 2.1 <5.0 (calc)   Non-HDL Cholesterol (Calc) 79 <869 mg/dL (calc)      Assessment & Plan:     ICD-10-CM   1. Essential hypertension  I10    Bp is improved and at goal now that she is back on the medications, encouraged good med compliance, diet/lifestyle efforts, managing stress and reg f/up ov    2. Other mixed anxiety disorders  F41.3    pt resumed meds, has some improving sx, still daily stress, encouraged pt to set work/life boundaries, therapist recommended, contiue meds    3. Viral syndrome  B34.9        4. Work stress  Z56.6    persistant stress with long hours, limit support, affecting sleep      Assessment & Plan  Pt scheduled for BP recheck and f/up on stress/anxiety after restarting her prescription medication after being lost to f/up care in office for 1-2 years.  1. Essential hypertension (Primary) Bp is improved and at goal now that she is back on the  medications, encouraged good med compliance, diet/lifestyle efforts, managing stress and reg f/up ov BP Readings from Last 3 Encounters:  10/22/23 122/74  09/30/23 (!) 168/102  10/01/22 (!) 142/86  Continue amlodipine , losartan -HCTZ   2. Other mixed anxiety disorders pt resumed meds, has some improving sx, still daily stress, encouraged pt to set work/life boundaries, therapist recommended, contiue meds- zoloft  and buspar  prn   3. Viral syndrome URI viral illness x 1 week not improving acute sx with HA, body aches, congestion, fever, fatigue, pt encouraged to rest and do some supportive/sx measures to help her recover Recommended when febrile to remain out of work due to high chance of being contagious but also resting and supportive care will help her recover -see below  4. Work stress persistant stress with long hours, limit support, affecting sleep Chronic for years, not changed much Supportive listening Significant work-related stress and anxiety managed with Zoloft  and BuSpar . - Continue Zoloft  and BuSpar  as prescribed. - Encourage taking time off work to rest and manage stress. - Reassess anxiety symptoms in three months.   5. Acute pansinusitis, recurrence not specified TTP on exam with sx lasting and worsening x 7 d Symptoms consistent with acute sinusitis warrant antibiotic treatment due to severity and duration. - Prescribe doxycycline  for seven days. - Advise monitoring for adverse effects, particularly gastrointestinal issues, and take two pantoprazole  if stomach issues arise.  6. Acute nonintractable headache, unspecified headache type Some improvement to intermittent HA's with one of the medications but she's not sure which  HTN is improved as well Overall improving and reassuring Headaches may be multifactorial and pt should montior and tx accordingly - stress, blood pressure, acute illness, sleep, hydration, tension HA etc. Relief with current medications  noted. - Monitor headache symptoms and identify which medication provides relief. - Reassess if headaches persist after sinusitis treatment.  7. Fatigue, unspecified type Last just done last month, pt currently acutely ill with some improving sx Likely multifactorial No indication for additional labs today - will continue to monitor   8. Gastroesophageal reflux disease, unspecified whether esophagitis present Prior dysphagia  Symptoms improved with pantoprazole , manageable with current treatment. - Continue pantoprazole  for a few more weeks, then attempt to transition back to Zantac. - Monitor for recurrence of symptoms.  Recording duration: 13 minutes     Return in about 3 months (around 01/22/2024) for HTN mood/meds .   Michelene Cower, PA-C 10/22/23 3:02 PM

## 2023-11-04 ENCOUNTER — Encounter: Payer: Self-pay | Admitting: Family Medicine

## 2023-11-04 MED ORDER — LOSARTAN POTASSIUM-HCTZ 50-12.5 MG PO TABS
1.0000 | ORAL_TABLET | Freq: Every day | ORAL | 0 refills | Status: AC
Start: 2023-11-04 — End: ?

## 2024-01-24 ENCOUNTER — Ambulatory Visit: Payer: Self-pay | Admitting: Family Medicine
